# Patient Record
Sex: Male | Born: 1965 | Race: Black or African American | Hispanic: No | Marital: Married | Smoking: Never smoker
Health system: Southern US, Community
[De-identification: ages and names within clinical notes are randomized; demographics above are authoritative.]

## PROBLEM LIST (undated history)

## (undated) DIAGNOSIS — I10 Essential (primary) hypertension: Secondary | ICD-10-CM

---

## 1988-04-21 DIAGNOSIS — M779 Enthesopathy, unspecified: Secondary | ICD-10-CM

## 1988-04-21 HISTORY — DX: Enthesopathy, unspecified: M77.9

## 1999-01-04 ENCOUNTER — Encounter: Payer: Self-pay | Admitting: Family Medicine

## 1999-01-04 ENCOUNTER — Ambulatory Visit (HOSPITAL_COMMUNITY): Admission: RE | Admit: 1999-01-04 | Discharge: 1999-01-04 | Payer: Self-pay | Admitting: Family Medicine

## 2000-10-15 ENCOUNTER — Encounter: Payer: Self-pay | Admitting: Family Medicine

## 2000-10-15 ENCOUNTER — Ambulatory Visit (HOSPITAL_COMMUNITY): Admission: RE | Admit: 2000-10-15 | Discharge: 2000-10-15 | Payer: Self-pay | Admitting: Family Medicine

## 2001-06-03 ENCOUNTER — Ambulatory Visit (HOSPITAL_COMMUNITY): Admission: RE | Admit: 2001-06-03 | Discharge: 2001-06-03 | Payer: Self-pay | Admitting: Family Medicine

## 2001-06-03 ENCOUNTER — Encounter: Payer: Self-pay | Admitting: Family Medicine

## 2001-10-20 ENCOUNTER — Encounter (INDEPENDENT_AMBULATORY_CARE_PROVIDER_SITE_OTHER): Payer: Self-pay | Admitting: Specialist

## 2001-10-20 ENCOUNTER — Ambulatory Visit (HOSPITAL_COMMUNITY): Admission: RE | Admit: 2001-10-20 | Discharge: 2001-10-20 | Payer: Self-pay | Admitting: Oncology

## 2004-05-29 ENCOUNTER — Ambulatory Visit: Payer: Self-pay | Admitting: Oncology

## 2005-05-27 ENCOUNTER — Ambulatory Visit: Payer: Self-pay | Admitting: Oncology

## 2007-11-01 ENCOUNTER — Encounter: Admission: RE | Admit: 2007-11-01 | Discharge: 2007-11-01 | Payer: Self-pay | Admitting: Family Medicine

## 2013-03-10 ENCOUNTER — Emergency Department (HOSPITAL_COMMUNITY)
Admission: EM | Admit: 2013-03-10 | Discharge: 2013-03-10 | Disposition: A | Discharge status: Home / Self Care | Attending: Emergency Medicine | Admitting: Emergency Medicine

## 2013-03-10 ENCOUNTER — Emergency Department (HOSPITAL_COMMUNITY)

## 2013-03-10 ENCOUNTER — Encounter (HOSPITAL_COMMUNITY): Payer: Self-pay | Admitting: Emergency Medicine

## 2013-03-10 DIAGNOSIS — Z79899 Other long term (current) drug therapy: Secondary | ICD-10-CM | POA: Insufficient documentation

## 2013-03-10 DIAGNOSIS — Y990 Civilian activity done for income or pay: Secondary | ICD-10-CM | POA: Insufficient documentation

## 2013-03-10 DIAGNOSIS — S62639B Displaced fracture of distal phalanx of unspecified finger, initial encounter for open fracture: Secondary | ICD-10-CM | POA: Insufficient documentation

## 2013-03-10 DIAGNOSIS — S61209A Unspecified open wound of unspecified finger without damage to nail, initial encounter: Secondary | ICD-10-CM | POA: Diagnosis not present

## 2013-03-10 DIAGNOSIS — S6710XA Crushing injury of unspecified finger(s), initial encounter: Secondary | ICD-10-CM | POA: Insufficient documentation

## 2013-03-10 DIAGNOSIS — Z880 Allergy status to penicillin: Secondary | ICD-10-CM | POA: Insufficient documentation

## 2013-03-10 DIAGNOSIS — S62632B Displaced fracture of distal phalanx of right middle finger, initial encounter for open fracture: Secondary | ICD-10-CM

## 2013-03-10 DIAGNOSIS — S6990XA Unspecified injury of unspecified wrist, hand and finger(s), initial encounter: Secondary | ICD-10-CM | POA: Diagnosis present

## 2013-03-10 DIAGNOSIS — S6980XA Other specified injuries of unspecified wrist, hand and finger(s), initial encounter: Secondary | ICD-10-CM | POA: Diagnosis present

## 2013-03-10 DIAGNOSIS — Y929 Unspecified place or not applicable: Secondary | ICD-10-CM | POA: Insufficient documentation

## 2013-03-10 DIAGNOSIS — W230XXA Caught, crushed, jammed, or pinched between moving objects, initial encounter: Secondary | ICD-10-CM | POA: Insufficient documentation

## 2013-03-10 MED ORDER — LIDOCAINE HCL 2 % IJ SOLN
10.0000 mL | Freq: Once | INTRAMUSCULAR | Status: DC
  Filled 2013-03-10: qty 20

## 2013-03-10 MED ORDER — CEPHALEXIN 500 MG PO CAPS: 500.0000 mg | ORAL_CAPSULE | Freq: Four times a day (QID) | ORAL | Status: DC

## 2013-03-10 MED ORDER — OXYCODONE-ACETAMINOPHEN 5-325 MG PO TABS
1.0000 | ORAL_TABLET | Freq: Once | ORAL | Status: AC
  Administered 2013-03-10: 1 via ORAL
  Filled 2013-03-10: qty 1

## 2013-03-10 MED ORDER — OXYCODONE-ACETAMINOPHEN 5-325 MG PO TABS: 2.0000 | ORAL_TABLET | ORAL | Status: DC | PRN

## 2013-03-10 NOTE — ED Notes (Signed)
Bowie at bedside 

## 2013-03-10 NOTE — ED Notes (Signed)
Suture cart at bedside 

## 2013-03-10 NOTE — ED Provider Notes (Signed)
CSN: [id number]     Arrival date & time 03/10/13  0605 History   First MD Initiated Contact with Patient 03/10/13 [phone]     Chief Complaint  Patient presents with  . Finger Injury   (Consider location/radiation/quality/duration/timing/severity/associated sxs/prior Treatment) HPI  47 year old male presents for evaluation of right middle finger injury. Approx 1-2 hrs ago pt was helping co-worker release a metal dock of a tractor truck when the dock plate fell and crushed his R middle finger against bed of the truck.  Report acute onset of sharp pain to affected finger follows with numbness sensation to tip of finger.  Pain non radiating, persistent, worsening with palpation.  No prior injury to same finger, is UTD with tetanus, no hx of diabetes.  No specific treatment tried.  R hand dominant.  History reviewed. No pertinent past medical history. History reviewed. No pertinent past surgical history. No family history on file. History  Substance Use Topics  . Smoking status: Never Smoker   . Smokeless tobacco: Not on file  . Alcohol Use: Not on file    Review of Systems  Constitutional: Negative for fever.  Skin: Positive for wound.  Neurological: Positive for numbness.    Allergies  Penicillins  Home Medications   Current Outpatient Rx  Name  Route  Sig  Dispense  Refill  . Multiple Vitamin (MULTIVITAMIN WITH MINERALS) TABS tablet   Oral   Take 1 tablet by mouth daily.         Marland Kitchen omega-3 acid ethyl esters (LOVAZA) 1 G capsule   Oral   Take 1 g by mouth daily.         . rosuvastatin (CRESTOR) 20 MG tablet   Oral   Take 20 mg by mouth every other day.          There were no vitals taken for this visit. Physical Exam  Nursing note and vitals reviewed. Constitutional: He appears well-developed and well-nourished. No distress.  HENT:  Head: Atraumatic.  Eyes: Conjunctivae are normal.  Neck: Neck supple.  Musculoskeletal: He exhibits tenderness (R middle finger:  tenderness throughout pad of finger with 2 laceration (1cm to medial pad, and 0.6cm to lateral  pad) , decreased sensation to tip.  no joint involvement, no nail involvement).  R middle finger with decreased flexion/extension at the DIP 2/2 pain, however due to location of injury doubt flexor/extensor tendon injury.    Neurological: He is alert.  Skin: No rash noted.    ED Course  Procedures (including critical care time)  6:38 AM Pt with crushed injury to R middle finger with open lac to pad of finger but no obvious nail involvement. Cyanotic distal tip, 2/2 to microvasculatures disruption. Is UTD with tetanus.  Since crush injury to tip of finger, unlikely to develop compartment syndrome.  Wound is irrigated, xray ordered, will suture lac.  Pain medication given.     7:44 AM LACERATION REPAIR Performed by: Fayrene Helper Authorized byFayrene Helper Consent: Verbal consent obtained. Risks and benefits: risks, benefits and alternatives were discussed Consent given by: patient Patient identity confirmed: provided demographic data Prepped and Draped in normal sterile fashion Wound explored  Laceration Location: R middle finger: Pad  Laceration Length: 1cm  No Foreign Bodies seen or palpated  Anesthesia: digital block  Local anesthetic: lidocaine 1% w/o epinephrine  Anesthetic total: 6 ml  Irrigation method: syringe Amount of cleaning: standard  Skin closure: prolene 5.0  Number of sutures: 4  Technique: simple interrupted  Patient tolerance:  Patient tolerated the procedure well with no immediate complications.  LACERATION REPAIR Performed by: Fayrene Helper Authorized byFayrene Helper Consent: Verbal consent obtained. Risks and benefits: risks, benefits and alternatives were discussed Consent given by: patient Patient identity confirmed: provided demographic data Prepped and Draped in normal sterile fashion Wound explored  Laceration Location: R middle finger:  pad  Laceration Length: 0.6 cm  No Foreign Bodies seen or palpated  Anesthesia: digital block  Local anesthetic: lidocaine lido% w/o epinephrine  Anesthetic total: 6 ml  Irrigation method: syringe Amount of cleaning: standard  Skin closure: prolene 5.0  Number of sutures: 2  Technique: simple interrupted  Patient tolerance: Patient tolerated the procedure well with no immediate complications.  7:55 AM Wound irrigated, lac sutured, finger splint applied, care instruction given, hand specialist referral given.      Labs Review Labs Reviewed - No data to display Imaging Review Dg Finger Middle Right  03/10/2013   CLINICAL DATA:  Crush injury distal long finger.  EXAM: RIGHT MIDDLE FINGER 2+V  COMPARISON:  None.  FINDINGS: The patient has a comminuted fracture of the distal phalanx of the long finger extending from the tuft into the mid diaphysis. There is mild volar displacement of a fragment of the tuft measuring 0.4 cm in diameter. Skin defect along the radial aspect of the patent is compatible laceration. No foreign body is identified.  IMPRESSION: Comminuted fracture distal phalanx right long finger as described.   Electronically Signed   By: Drusilla Kanner M.D.   On: 03/10/2013 07:08    EKG Interpretation   None       MDM   1. Open displaced fracture of distal phalanx of right middle finger, initial encounter    BP 141/85  Pulse 66  Temp(Src) 96.8 F (36 C) (Oral)  Resp 16  SpO2 100%  I have reviewed nursing notes and vital signs. I personally reviewed the imaging tests through PACS system  I reviewed available ER/hospitalization records thought the EMR      Fayrene Helper, [place] 03/10/13 3086

## 2013-03-10 NOTE — ED Notes (Signed)
Pt instructed to soak affected finger in sterile saline/iodine solution.

## 2013-03-10 NOTE — ED Provider Notes (Signed)
Medical screening examination/treatment/procedure(s) were performed by non-physician practitioner and as supervising physician I was immediately available for consultation/collaboration.   Royce Sciara, MD 03/10/13 2343 

## 2013-03-10 NOTE — ED Notes (Addendum)
Pt presents here to ED with deep, unapproximated laceration to the tip of his right middle finger, bleeding controlled. Reports he was helping friend release the metal dock of a tractor trailor truck when the dock plate "lip part" landed on finger and cut finger. Pt able to move hand and barely bend finger, "it just feels numb." Tip of finger appears swollen and blue.

## 2013-03-10 NOTE — ED Notes (Signed)
Ortho pages and responded. enroute.

## 2013-03-10 NOTE — ED Notes (Signed)
Bowie Tran, PA at bedside 

## 2013-03-10 NOTE — Progress Notes (Signed)
Orthopedic Tech Progress Note Patient Details:  Peter Ortiz [birth year] [id number]  Ortho Devices Type of Ortho Device: Finger splint   Haskell Flirt 03/10/2013, 8:05 AM

## 2014-02-01 ENCOUNTER — Ambulatory Visit (INDEPENDENT_AMBULATORY_CARE_PROVIDER_SITE_OTHER): Payer: Federal, State, Local not specified - PPO | Admitting: Sports Medicine

## 2014-02-01 ENCOUNTER — Encounter: Payer: Self-pay | Admitting: Sports Medicine

## 2014-02-01 VITALS — BP 144/85 | HR 71 | Ht 69.0 in | Wt 182.0 lb

## 2014-02-01 DIAGNOSIS — M25571 Pain in right ankle and joints of right foot: Secondary | ICD-10-CM

## 2014-02-01 MED ORDER — MELOXICAM 15 MG PO TABS: ORAL_TABLET | ORAL | Status: DC

## 2014-02-02 NOTE — Progress Notes (Signed)
   Subjective:    Patient ID: Peter Ortiz, male    DOB: 23-Jun-1965, 48 y.o.   MRN: [name]  HPI chief complaint: Right foot pain  Very pleasant 48 year old runner comes in today complaining of 3 weeks of dorsal right foot pain. No injury that he can recall but gradual onset of pain that he localizes to the dorsal midfoot. It was initially present only with running but is now present with walking. In fact, he is walking with a limp now. He runs 3-4 miles 3 times a week. This has been consistent for several months. He denies any increase in activity. Denies any significant problems with his feet in the past. No numbness or tingling. Pain resolves with rest. He recently saw Dr. [facility] and x-rays of his foot per Dr. She[facility]'s report showed some mild midfoot degenerative changes but was otherwise unremarkable. Dr. Thurston [facility] then referred him to our office for running evaluation and consideration of orthotics. He has been taking some intermittent over-the-counter Aleve which has been somewhat helpful. He has not been running for the past 3 weeks.  Past medical history reviewed. Medications reviewed. No known drug allergies. Socially he does not smoke, does not drink, and works for the [employer].    Review of Systems     Objective:   Physical Exam Well-developed, fit-appearing. No acute distress. Awake alert and oriented x3. Vital signs reviewed.  Right foot: There is tenderness to palpation across the dorsum of the midfoot. Mild soft tissue swelling in this area as well. No ecchymosis. No tenderness over the navicular. No tenderness to palpation at the ankle. No pain with resisted great toe extension or extension of the lesser toes. Fairly neutral arch. Evaluation of his gait shows him to supinate slightly. He is walking with a limp. I am unable to evaluate his running form do to his limping.       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  His symptoms have been present now for 3 weeks and are worsening. He is now walking with a limp. The nature of his job is keeping his foot aggravated so I think we need to keep him out of work for the next 2 weeks until his next followup appointment with me. In the interim, I will place him into a short Cam Walker to rest the foot. I will place him on meloxicam 15 mg daily for 10 days. He is cautioned about GI upset. He will start icing his foot daily. I also gave him an arch strap to try. Once his symptoms resolve we will do a more thorough running evaluation. Patient will need an insert with a lateral heel wedge to correct his supination. If symptoms persist at followup I would consider merits of further diagnostic imaging. I've asked the patient to drop off his x-rays from [facility] for my review prior to his next visit.

## 2014-02-15 ENCOUNTER — Ambulatory Visit (INDEPENDENT_AMBULATORY_CARE_PROVIDER_SITE_OTHER): Payer: Federal, State, Local not specified - PPO | Admitting: Sports Medicine

## 2014-02-15 VITALS — BP 134/83 | HR 74 | Ht 69.0 in | Wt 182.0 lb

## 2014-02-15 DIAGNOSIS — M25571 Pain in right ankle and joints of right foot: Secondary | ICD-10-CM

## 2014-02-16 NOTE — Progress Notes (Signed)
   Subjective:    Patient ID: Peter Ortiz, male    DOB: 1966-04-07, 48 y.o.   MRN: [id number]  HPI Patient comes in today for follow-up on dorsal right foot pain. Pain has resolved with 3 weeks of immobilization in a Cam Walker. Swelling has resolved as well. He has been out of work since his last office visit. His arch strap has been helpful as well. Meloxicam was also helpful. He brought a copy of [facility] for me to review.    Review of Systems     Objective:   Physical Exam Well-developed, fit appearing. No acute distress. Awake alert and oriented 3  Right foot: Flexible pes cavus foot. No tenderness to palpation across the dorsum of the foot. No soft tissue swelling. Negative metatarsal squeeze. Patient walks and runs with a slightly supinated gait. Right foot is slightly externally rotated as well. No limp. Right hip demonstrates a tight IT band. Negative log roll. X-rays of the right foot dated 01/19/2014 from [facility] are reviewed. Overall they appear to be fairly normal. There may be some mild midfoot degenerative changes but otherwise unremarkable.       Assessment & Plan:  Improved dorsal right foot pain likely secondary to mild midfoot DJD Supination  Green sports insoles with lateral heel wedges to help correct supination. IT band stretches and line drills to help correct his externally rotated right foot with running. However, I do not yet want the patient to resume running. Instead, I would like for him to return to work next week and make sure that he can do his job without returning pain before starting any running. He will follow-up with me in 3 weeks. Continue with his arch strap. If symptoms return I would consider merits further diagnostic imaging. If patient is still doing well at his follow-up visit, I would consider custom orthotics. Call with questions or concerns prior to his follow-up visit.

## 2014-03-08 ENCOUNTER — Ambulatory Visit: Payer: Self-pay | Admitting: Sports Medicine

## 2014-11-22 ENCOUNTER — Encounter: Payer: Self-pay | Admitting: Sports Medicine

## 2014-11-22 ENCOUNTER — Ambulatory Visit (INDEPENDENT_AMBULATORY_CARE_PROVIDER_SITE_OTHER): Payer: Federal, State, Local not specified - PPO | Admitting: Sports Medicine

## 2014-11-22 VITALS — Ht 68.0 in | Wt 184.0 lb

## 2014-11-22 DIAGNOSIS — M79673 Pain in unspecified foot: Secondary | ICD-10-CM | POA: Diagnosis not present

## 2014-11-22 DIAGNOSIS — M79602 Pain in left arm: Secondary | ICD-10-CM

## 2014-11-22 MED ORDER — NAPROXEN 500 MG PO TABS: 500.0000 mg | ORAL_TABLET | Freq: Two times a day (BID) | ORAL | Status: DC

## 2014-11-22 NOTE — Progress Notes (Signed)
   Subjective:    Patient ID: Peter Ortiz, male    DOB: [birth year], 49 y.o.   MRN: [id number]  HPI chief complaint: Left forearm pain  Patient comes in today complaining of 3-4 weeks of left forearm pain. No trauma. He denies any change in activity. He describes a burning type of pain that is primarily along the proximal radial side of the forearm. No swelling. No pain in the elbow. No associated numbness or tingling. He notices it primarily with holding the elbow in a flexed position and supination of the forearm. It does not interfere with his ability to be active. He denies similar symptoms in the past.  Past medical history reviewed Medications reviewed Allergies reviewed    Review of Systems     Objective:   Physical Exam Well-developed, well-nourished. No acute distress  Left arm: Full painless elbow range of motion. No effusion. No tenderness to palpation at the lateral epicondyle. There is slight tenderness to palpation along the extensor muscle wad as well as in the radial tunnel. No soft tissue swelling. No erythema. No rash. Good grip strength. Good radial ulnar pulses.       Assessment & Plan:  Left forearm pain likely secondary to simple muscle strain vs radial tunnel syndrome  Naprosyn 500 mg twice a day for 7 days then when necessary. Body helix compression sleeve with activity. Avoid aggravating activity but continue with other activity as tolerated. Follow-up with me in 3 weeks if symptoms persist at which point I would consider imaging.  Of note, patient was provided with new bilateral arch straps today as well as. This is for chronic bilateral foot pain.

## 2014-12-11 ENCOUNTER — Other Ambulatory Visit: Payer: Self-pay | Admitting: *Deleted

## 2014-12-11 MED ORDER — NAPROXEN 500 MG PO TABS: ORAL_TABLET | ORAL | Status: DC

## 2014-12-13 ENCOUNTER — Ambulatory Visit: Payer: Federal, State, Local not specified - PPO | Admitting: Sports Medicine

## 2015-05-21 ENCOUNTER — Ambulatory Visit: Payer: Federal, State, Local not specified - PPO | Admitting: Sports Medicine

## 2015-05-30 ENCOUNTER — Encounter: Payer: Self-pay | Admitting: Sports Medicine

## 2015-05-30 ENCOUNTER — Ambulatory Visit
Admission: RE | Admit: 2015-05-30 | Discharge: 2015-05-30 | Disposition: A | Discharge status: Home / Self Care | Payer: Federal, State, Local not specified - PPO | Source: Ambulatory Visit | Attending: Sports Medicine | Admitting: Sports Medicine

## 2015-05-30 ENCOUNTER — Ambulatory Visit (INDEPENDENT_AMBULATORY_CARE_PROVIDER_SITE_OTHER): Payer: Federal, State, Local not specified - PPO | Admitting: Sports Medicine

## 2015-05-30 VITALS — BP 132/76 | HR 66 | Ht 68.0 in | Wt 184.0 lb

## 2015-05-30 DIAGNOSIS — M25562 Pain in left knee: Secondary | ICD-10-CM

## 2015-05-30 MED ORDER — NAPROXEN 500 MG PO TABS: ORAL_TABLET | ORAL | Status: DC

## 2015-05-30 NOTE — Progress Notes (Signed)
   Subjective:    Patient ID: Peter Ortiz, male    DOB: 1966-03-01, 50 y.o.   MRN: [id number]  HPI chief complaint: Left knee pain  50 year old male comes in today complaining of intermittent medial sided left knee pain which has been going on for about 2 months. He denies any trauma. He describes an aching type of discomfort along the medial knee which is present primarily with running. He enjoys running on the treadmill. When his knee first started hurting he took some naproxen sodium which did help. When his pain resolved he try to resume running but his symptoms returned. He denies any swelling. No feelings of instability. No locking or catching. No prior problems with the knee in the past. No prior knee surgeries. No radiation of the pain. No associated numbness or tingling. He is able to do other types of exercise such as the elliptical without any pain. He has not had any imaging of his knee.    Review of Systems    as above Objective:   Physical Exam  Well-developed, well-nourished. No acute distress. Awake alert and oriented 3. Vital signs reviewed  Left knee: Full range of motion. No effusion. He is tender to palpation along the medial joint line with pain but no popping with McMurray's. Positive Thessaly's. No tenderness along the lateral joint line. Trace patellofemoral crepitus. Knee is stable to valgus and varus stressing. Negative anterior drawer, negative posterior drawer. No popliteal cyst. No tenderness to palpation directly over the pes anserine bursa. Neurovascularly intact distally. Walking with a slight limp..      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 Since he did have good symptom relief with naproxen sodium I have elected to refill this. He will take it for 7 days and then as needed. We will also fit him with a compression sleeve to wear with  activity. He will start isometric quad exercises and follow-up with me in 6 weeks. I want him to cross train for the next 4 weeks (no running). He can then resume running as symptoms allow. He will get x-rays of his knee prior to his follow-up visit with me. We will delineate further workup and treatment based on his x-ray results and response to today's treatment.

## 2015-05-31 ENCOUNTER — Telehealth: Payer: Self-pay | Admitting: Sports Medicine

## 2015-05-31 NOTE — Telephone Encounter (Signed)
I left a message today on the patient's voicemail regarding the x-ray results of his left knee. He has minimal joint space narrowing in the medial compartment. Otherwise, no significant degenerative changes. He will follow-up with me in 6 weeks as scheduled. If symptoms persist I may need to consider further diagnostic imaging.

## 2015-07-10 ENCOUNTER — Encounter: Payer: Self-pay | Admitting: Sports Medicine

## 2015-07-10 ENCOUNTER — Ambulatory Visit (INDEPENDENT_AMBULATORY_CARE_PROVIDER_SITE_OTHER): Payer: Federal, State, Local not specified - PPO | Admitting: Sports Medicine

## 2015-07-10 VITALS — BP 138/78 | Ht 68.0 in | Wt 184.0 lb

## 2015-07-10 DIAGNOSIS — M25562 Pain in left knee: Secondary | ICD-10-CM

## 2015-07-10 NOTE — Progress Notes (Signed)
   Subjective:    Patient ID: Peter Ortiz, male    DOB: 19-Mar-1966, 50 y.o.   MRN: [id number]  HPI   Patient comes in today for follow-up on left knee pain. Overall, he feels like his pain has improved. He has no pain with walking. He did return to some limited running a couple of weeks ago but had some pain a couple of days after his second run. No swelling. No instability. No locking or catching. He took his naproxen sodium for 7 days and does feel like that made a difference. He is currently not taking any anti-inflammatories or Tylenol. The compression sleeve would continuously roll down his leg with activity so he discontinued using it. In addition to running he has also started bowling again. No pain with bowling. No significant pain at rest. He has been diligent about doing his home exercises and feels like they are helping.    Review of Systems As above    Objective:   Physical Exam  Well-developed, well-nourished. No acute distress. Awake alert and oriented 3. Vital signs reviewed  Left knee: Patient continues to demonstrate full range of motion. No effusion. He is still tender to palpation along the medial joint line but a negative McMurray's. Good joint stability. Trace patellofemoral crepitus. Neurovascularly intact distally. Walking without a noticeable limp.  X-rays of his left knee show some mild medial compartmental narrowing consistent with mild DJD here. Otherwise, unremarkable.      Assessment & Plan:   Left knee pain secondary to mild medial compartmental DJD versus degenerative meniscal tear  I discussed the patient's treatment options including continued activity modification, cortisone injection, and MRI. At this point we are going to have him continue with activity modification and his home exercise program. I've also given him the name of [employer] and he will contact them directly for a new compression sleeve which will hopefully be better fitting. I think  the combination of bowling and running is causing him some persistent discomfort. I have asked that he try to space out these activities over several days and not bowl or run on back-to-back days. He does not have any mechanical symptoms nor does he have swelling. I'm hoping that a longer period of relative rest will improve his pain. He understands that if his symptoms persist, then he can return to the office for cortisone injection. With his lack of mechanical symptoms I would likely start with the injection prior to further diagnostic imaging. Patient agrees. He will continue with his home exercise program. Follow-up with me as needed.

## 2015-10-15 DIAGNOSIS — J039 Acute tonsillitis, unspecified: Secondary | ICD-10-CM | POA: Diagnosis not present

## 2016-01-28 DIAGNOSIS — Z23 Encounter for immunization: Secondary | ICD-10-CM | POA: Diagnosis not present

## 2016-04-07 DIAGNOSIS — M654 Radial styloid tenosynovitis [de Quervain]: Secondary | ICD-10-CM | POA: Diagnosis not present

## 2016-04-11 ENCOUNTER — Ambulatory Visit: Payer: Self-pay | Admitting: Sports Medicine

## 2016-05-14 DIAGNOSIS — R1902 Left upper quadrant abdominal swelling, mass and lump: Secondary | ICD-10-CM | POA: Diagnosis not present

## 2016-05-19 ENCOUNTER — Other Ambulatory Visit: Payer: Self-pay | Admitting: Physician Assistant

## 2016-05-19 DIAGNOSIS — R1010 Upper abdominal pain, unspecified: Secondary | ICD-10-CM

## 2016-05-26 ENCOUNTER — Other Ambulatory Visit: Payer: Self-pay | Admitting: Physician Assistant

## 2016-05-26 ENCOUNTER — Ambulatory Visit
Admission: RE | Admit: 2016-05-26 | Discharge: 2016-05-26 | Disposition: A | Discharge status: Home / Self Care | Payer: Federal, State, Local not specified - PPO | Source: Ambulatory Visit | Attending: Physician Assistant | Admitting: Physician Assistant

## 2016-05-26 DIAGNOSIS — R1010 Upper abdominal pain, unspecified: Secondary | ICD-10-CM

## 2016-05-26 DIAGNOSIS — R1902 Left upper quadrant abdominal swelling, mass and lump: Secondary | ICD-10-CM | POA: Diagnosis not present

## 2016-07-15 DIAGNOSIS — E782 Mixed hyperlipidemia: Secondary | ICD-10-CM | POA: Diagnosis not present

## 2016-07-15 DIAGNOSIS — Z125 Encounter for screening for malignant neoplasm of prostate: Secondary | ICD-10-CM | POA: Diagnosis not present

## 2016-07-15 DIAGNOSIS — Z0000 Encounter for general adult medical examination without abnormal findings: Secondary | ICD-10-CM | POA: Diagnosis not present

## 2016-07-30 DIAGNOSIS — L309 Dermatitis, unspecified: Secondary | ICD-10-CM | POA: Diagnosis not present

## 2016-07-30 DIAGNOSIS — L738 Other specified follicular disorders: Secondary | ICD-10-CM | POA: Diagnosis not present

## 2017-01-26 DIAGNOSIS — I10 Essential (primary) hypertension: Secondary | ICD-10-CM | POA: Diagnosis not present

## 2017-01-26 DIAGNOSIS — K602 Anal fissure, unspecified: Secondary | ICD-10-CM | POA: Diagnosis not present

## 2017-01-26 DIAGNOSIS — Z23 Encounter for immunization: Secondary | ICD-10-CM | POA: Diagnosis not present

## 2017-02-16 DIAGNOSIS — J0100 Acute maxillary sinusitis, unspecified: Secondary | ICD-10-CM | POA: Diagnosis not present

## 2017-03-02 DIAGNOSIS — J3489 Other specified disorders of nose and nasal sinuses: Secondary | ICD-10-CM | POA: Diagnosis not present

## 2017-04-16 DIAGNOSIS — S76312A Strain of muscle, fascia and tendon of the posterior muscle group at thigh level, left thigh, initial encounter: Secondary | ICD-10-CM | POA: Diagnosis not present

## 2017-05-11 DIAGNOSIS — M79652 Pain in left thigh: Secondary | ICD-10-CM | POA: Diagnosis not present

## 2017-05-27 ENCOUNTER — Other Ambulatory Visit: Payer: Self-pay | Admitting: Family Medicine

## 2017-05-27 DIAGNOSIS — M79652 Pain in left thigh: Secondary | ICD-10-CM

## 2017-05-31 ENCOUNTER — Ambulatory Visit
Admission: RE | Admit: 2017-05-31 | Discharge: 2017-05-31 | Disposition: A | Discharge status: Home / Self Care | Payer: Federal, State, Local not specified - PPO | Source: Ambulatory Visit | Attending: Family Medicine | Admitting: Family Medicine

## 2017-05-31 DIAGNOSIS — M79652 Pain in left thigh: Secondary | ICD-10-CM

## 2017-06-04 DIAGNOSIS — M79652 Pain in left thigh: Secondary | ICD-10-CM | POA: Diagnosis not present

## 2017-06-04 DIAGNOSIS — M25552 Pain in left hip: Secondary | ICD-10-CM | POA: Diagnosis not present

## 2017-07-20 DIAGNOSIS — M25552 Pain in left hip: Secondary | ICD-10-CM | POA: Diagnosis not present

## 2017-07-20 DIAGNOSIS — M79652 Pain in left thigh: Secondary | ICD-10-CM | POA: Diagnosis not present

## 2017-07-21 DIAGNOSIS — A6000 Herpesviral infection of urogenital system, unspecified: Secondary | ICD-10-CM | POA: Diagnosis not present

## 2017-07-21 DIAGNOSIS — J309 Allergic rhinitis, unspecified: Secondary | ICD-10-CM | POA: Diagnosis not present

## 2017-07-21 DIAGNOSIS — Z0000 Encounter for general adult medical examination without abnormal findings: Secondary | ICD-10-CM | POA: Diagnosis not present

## 2017-07-21 DIAGNOSIS — Z125 Encounter for screening for malignant neoplasm of prostate: Secondary | ICD-10-CM | POA: Diagnosis not present

## 2017-07-21 DIAGNOSIS — I10 Essential (primary) hypertension: Secondary | ICD-10-CM | POA: Diagnosis not present

## 2017-07-21 DIAGNOSIS — E782 Mixed hyperlipidemia: Secondary | ICD-10-CM | POA: Diagnosis not present

## 2017-07-29 DIAGNOSIS — M79652 Pain in left thigh: Secondary | ICD-10-CM | POA: Diagnosis not present

## 2017-07-29 DIAGNOSIS — M25552 Pain in left hip: Secondary | ICD-10-CM | POA: Diagnosis not present

## 2017-08-05 DIAGNOSIS — M25552 Pain in left hip: Secondary | ICD-10-CM | POA: Diagnosis not present

## 2017-08-05 DIAGNOSIS — M79652 Pain in left thigh: Secondary | ICD-10-CM | POA: Diagnosis not present

## 2017-08-12 DIAGNOSIS — M25552 Pain in left hip: Secondary | ICD-10-CM | POA: Diagnosis not present

## 2017-08-12 DIAGNOSIS — M79652 Pain in left thigh: Secondary | ICD-10-CM | POA: Diagnosis not present

## 2017-08-19 DIAGNOSIS — M25552 Pain in left hip: Secondary | ICD-10-CM | POA: Diagnosis not present

## 2017-08-19 DIAGNOSIS — M79652 Pain in left thigh: Secondary | ICD-10-CM | POA: Diagnosis not present

## 2017-12-15 DIAGNOSIS — Z23 Encounter for immunization: Secondary | ICD-10-CM | POA: Diagnosis not present

## 2017-12-15 DIAGNOSIS — N529 Male erectile dysfunction, unspecified: Secondary | ICD-10-CM | POA: Diagnosis not present

## 2017-12-23 ENCOUNTER — Encounter: Payer: Self-pay | Admitting: Podiatry

## 2017-12-23 ENCOUNTER — Ambulatory Visit: Payer: Federal, State, Local not specified - PPO | Admitting: Podiatry

## 2017-12-23 ENCOUNTER — Other Ambulatory Visit: Payer: Self-pay

## 2017-12-23 DIAGNOSIS — B351 Tinea unguium: Secondary | ICD-10-CM

## 2017-12-23 MED ORDER — TERBINAFINE HCL 250 MG PO TABS: 250.0000 mg | ORAL_TABLET | Freq: Every day | ORAL | 0 refills | Status: DC

## 2017-12-27 NOTE — Progress Notes (Signed)
Subjective:   Patient ID: Peter Ortiz, male   DOB: 52 y.o.   MRN: [id number]   HPI Patient presents stating he has had a lot of irritation between the second third third fourth toes of his left foot and also has had nail discoloration that occurred.  Patient states it irritated and itchy and he like to have treatment and is tried topical over-the-counter medication.  Patient does not smoke and likes to be active   Review of Systems  All other systems reviewed and are negative.       Objective:  Physical Exam  Constitutional: He appears well-developed and well-nourished.  Cardiovascular: Intact distal pulses.  Pulmonary/Chest: Effort normal.  Musculoskeletal: Normal range of motion.  Neurological: He is alert.  Skin: Skin is warm.  Nursing note and vitals reviewed.   Neurovascular status found to be intact muscle strength is adequate range of motion within normal limits with patient noted to have irritation between the second third third toes left foot that is localized with no proximal edema erythema or drainage noted     Assessment:  Irritation that is most likely fungal in nature with also mild mycotic nail disease present     Plan:  H&P education on condition given to patient and I recommended oral Lamisil therapy.  Patient is written prescription for 60 days of oral Lamisil and I did explain the risk associated with this and patient will start taking this medication.  Patient did have liver function studies done and they were normal and is encouraged to also send me that result but he does state that they were normal

## 2018-02-08 DIAGNOSIS — H9313 Tinnitus, bilateral: Secondary | ICD-10-CM | POA: Diagnosis not present

## 2018-02-25 DIAGNOSIS — S39013A Strain of muscle, fascia and tendon of pelvis, initial encounter: Secondary | ICD-10-CM | POA: Diagnosis not present

## 2018-02-25 DIAGNOSIS — H9313 Tinnitus, bilateral: Secondary | ICD-10-CM | POA: Diagnosis not present

## 2018-03-04 DIAGNOSIS — H9313 Tinnitus, bilateral: Secondary | ICD-10-CM | POA: Diagnosis not present

## 2018-05-11 DIAGNOSIS — L309 Dermatitis, unspecified: Secondary | ICD-10-CM | POA: Diagnosis not present

## 2018-07-27 DIAGNOSIS — N529 Male erectile dysfunction, unspecified: Secondary | ICD-10-CM | POA: Diagnosis not present

## 2018-07-27 DIAGNOSIS — Z125 Encounter for screening for malignant neoplasm of prostate: Secondary | ICD-10-CM | POA: Diagnosis not present

## 2018-07-27 DIAGNOSIS — I10 Essential (primary) hypertension: Secondary | ICD-10-CM | POA: Diagnosis not present

## 2018-07-27 DIAGNOSIS — A6000 Herpesviral infection of urogenital system, unspecified: Secondary | ICD-10-CM | POA: Diagnosis not present

## 2018-07-27 DIAGNOSIS — E782 Mixed hyperlipidemia: Secondary | ICD-10-CM | POA: Diagnosis not present

## 2018-07-27 DIAGNOSIS — Z0000 Encounter for general adult medical examination without abnormal findings: Secondary | ICD-10-CM | POA: Diagnosis not present

## 2018-09-03 DIAGNOSIS — S86811A Strain of other muscle(s) and tendon(s) at lower leg level, right leg, initial encounter: Secondary | ICD-10-CM | POA: Diagnosis not present

## 2018-09-30 DIAGNOSIS — S86811D Strain of other muscle(s) and tendon(s) at lower leg level, right leg, subsequent encounter: Secondary | ICD-10-CM | POA: Diagnosis not present

## 2018-11-01 DIAGNOSIS — T63444A Toxic effect of venom of bees, undetermined, initial encounter: Secondary | ICD-10-CM | POA: Diagnosis not present

## 2019-03-15 ENCOUNTER — Other Ambulatory Visit: Payer: Self-pay

## 2019-03-15 DIAGNOSIS — Z20822 Contact with and (suspected) exposure to covid-19: Secondary | ICD-10-CM

## 2019-03-17 LAB — NOVEL CORONAVIRUS, NAA: SARS-CoV-2, NAA: NOT DETECTED

## 2019-03-21 DIAGNOSIS — A6000 Herpesviral infection of urogenital system, unspecified: Secondary | ICD-10-CM | POA: Diagnosis not present

## 2019-05-04 ENCOUNTER — Encounter: Payer: Self-pay | Admitting: Allergy and Immunology

## 2019-05-04 ENCOUNTER — Ambulatory Visit: Payer: Federal, State, Local not specified - PPO | Admitting: Allergy and Immunology

## 2019-05-04 ENCOUNTER — Other Ambulatory Visit: Payer: Self-pay

## 2019-05-04 VITALS — BP 140/80 | HR 72 | Temp 98.4°F | Resp 16 | Ht 68.11 in | Wt 191.4 lb

## 2019-05-04 DIAGNOSIS — R05 Cough: Secondary | ICD-10-CM | POA: Diagnosis not present

## 2019-05-04 DIAGNOSIS — J310 Chronic rhinitis: Secondary | ICD-10-CM

## 2019-05-04 DIAGNOSIS — R040 Epistaxis: Secondary | ICD-10-CM | POA: Insufficient documentation

## 2019-05-04 DIAGNOSIS — R053 Chronic cough: Secondary | ICD-10-CM

## 2019-05-04 MED ORDER — CARBINOXAMINE MALEATE 4 MG PO TABS: ORAL_TABLET | ORAL | 5 refills | Status: DC

## 2019-05-04 MED ORDER — AZELASTINE HCL 0.1 % NA SOLN: NASAL | 5 refills | Status: DC

## 2019-05-04 NOTE — Assessment & Plan Note (Signed)
   Proper technique for stanching epistaxis has been discussed and demonstrated.  Nasal saline spray and/or nasal saline gel is recommended to moisturize nasal mucosa.  The use of a cool-mist humidifier during the night is recommended.  During epistaxis, if needed, oxymetazoline (Afrin) nasal spray may be applied to a cotton ball to help stanch the blood flow.  If this problem persists or progresses, otolaryngology evaluation may be warranted.  

## 2019-05-04 NOTE — Progress Notes (Signed)
New Patient Note  RE: [name] MRN: [id number] DOB: 1965/12/19 Date of Office Visit: 05/04/2019  Referring provider: Tally Joe, MD Primary care provider: Tally Joe, MD  Chief Complaint: Allergic Rhinitis  and Cough  History of present illness: Peter Ortiz is a 54 y.o. male seen today in consultation requested by Tally Joe, MD.  He reports that over the past 6 months he has experienced nasal congestion, rhinorrhea, sneezing, nasal pruritus, ocular pruritus, and sinus pressure over the cheekbones.  He notes that he has had more dust exposure over the past 6 months at work.  Otherwise, no significant seasonal symptom variation has been noted nor have specific environmental triggers been identified.  He attempts to control the symptoms with fexofenadine 180 mg daily which has provided moderate benefit.  He also complains of a persistent cough.  The cough is described as dry/nonproductive and originates in the throat.  He denies chest tightness, dyspnea, and wheezing.  He experiences heartburn occasionally and takes famotidine as needed.  He experiences mild epistaxis out of the right nostril 1 time per month on average.  The bleeding typically stops without active measures to stanch the bleed.  Assessment and plan: Chronic rhinitis All seasonal and perennial aeroallergen skin tests are negative despite a positive histamine control.  Intranasal steroids, intranasal antihistamines, and first generation antihistamines are effective for symptoms associated with non-allergic rhinitis, whereas second generation antihistamines such as cetirizine (Zyrtec), loratadine (Claritin) and fexofenadine (Allegra) have been found to be ineffective for this condition.  A prescription has been provided for azelastine nasal spray, one spray per nostril 1-2 times daily as needed. Proper nasal spray technique has been discussed and demonstrated.  Nasal saline lavage (NeilMed) has been recommended  as needed and prior to medicated nasal sprays along with instructions for proper administration.  A prescription has been provided for carbinoxamine maleate 4mg  every 6-8 hours as needed.  Cough, persistent The patient's history and physical examination suggest upper airway cough syndrome.  Spirometry today reveals normal ventilatory function. We will aggressively treat postnasal drainage and evaluate results.  Treatment plan as outlined above.  For thick post nasal drainage, add guaifenesin 1200 mg (Mucinex Maximum Strength)  twice daily as needed with adequate hydration as discussed.  Epistaxis  Proper technique for stanching epistaxis has been discussed and demonstrated.  Nasal saline spray and/or nasal saline gel is recommended to moisturize nasal mucosa.  The use of a cool-mist humidifier during the night is recommended.  During epistaxis, if needed, oxymetazoline (Afrin) nasal spray may be applied to a cotton ball to help stanch the blood flow.  If this problem persists or progresses, otolaryngology evaluation may be warranted.    Meds ordered this encounter  Medications  . azelastine (ASTELIN) 0.1 % nasal spray    Sig: 1 spray per nostril 1-2 times a day as needed    Dispense:  30 mL    Refill:  5  . Carbinoxamine Maleate 4 MG TABS    Sig: One tablet every 6-8 hours as needed    Dispense:  120 tablet    Refill:  5    Diagnostics: Spirometry: FVC was 2.85 L and FEV1 was 2.61 L (86% predicted) without postbronchodilator improvement.  Please see scanned spirometry results for details. Epicutaneous testing: Negative despite a positive histamine control. Intradermal testing: Negative.   Physical examination: Blood pressure 140/80, pulse 72, temperature 98.4 F (36.9 C), temperature source Oral, resp. rate 16, height 5' 8.11" (1.73 m), weight  191 lb 6.4 oz (86.8 kg), SpO2 99 %.  General: Alert, interactive, in no acute distress. HEENT: TMs pearly gray, turbinates  moderately edematous without discharge, post-pharynx erythematous. Neck: Supple without lymphadenopathy. Lungs: Clear to auscultation without wheezing, rhonchi or rales. CV: Normal S1, S2 without murmurs. Abdomen: Nondistended, nontender. Skin: Warm and dry, without lesions or rashes. Extremities:  No clubbing, cyanosis or edema. Neuro:   Grossly intact.  Review of systems:  Review of systems negative except as noted in HPI / PMHx or noted below: Review of Systems  Constitutional: Negative.   HENT: Negative.   Eyes: Negative.   Respiratory: Negative.   Cardiovascular: Negative.   Gastrointestinal: Negative.   Genitourinary: Negative.   Musculoskeletal: Negative.   Skin: Negative.   Neurological: Negative.   Endo/Heme/Allergies: Negative.   Psychiatric/Behavioral: Negative.     Past medical history:  Past Medical History:  Diagnosis Date  . Tendinitis 1990    Past surgical history:  History reviewed. No pertinent surgical history.  Family history: Family History  Problem Relation Age of Onset  . Asthma Maternal Uncle   . Allergic rhinitis Neg Hx   . Angioedema Neg Hx   . Atopy Neg Hx   . Eczema Neg Hx   . Immunodeficiency Neg Hx   . Urticaria Neg Hx     Social history: Social History   Socioeconomic History  . Marital status: Married    Spouse name: Not on file  . Number of children: Not on file  . Years of education: Not on file  . Highest education level: Not on file  Occupational History  . Not on file  Tobacco Use  . Smoking status: Never Smoker  . Smokeless tobacco: Never Used  Substance and Sexual Activity  . Alcohol use: Not on file  . Drug use: Not on file  . Sexual activity: Not on file  Other Topics Concern  . Not on file  Social History Narrative  . Not on file   Social Determinants of Health   Financial Resource Strain:   . Difficulty of Paying Living Expenses: Not on file  Food Insecurity:   . Worried About Charity fundraiser in  the Last Year: Not on file  . Ran Out of Food in the Last Year: Not on file  Transportation Needs:   . Lack of Transportation (Medical): Not on file  . Lack of Transportation (Non-Medical): Not on file  Physical Activity:   . Days of Exercise per Week: Not on file  . Minutes of Exercise per Session: Not on file  Stress:   . Feeling of Stress : Not on file  Social Connections:   . Frequency of Communication with Friends and Family: Not on file  . Frequency of Social Gatherings with Friends and Family: Not on file  . Attends Religious Services: Not on file  . Active Member of Clubs or Organizations: Not on file  . Attends Archivist Meetings: Not on file  . Marital Status: Not on file  Intimate Partner Violence:   . Fear of Current or Ex-Partner: Not on file  . Emotionally Abused: Not on file  . Physically Abused: Not on file  . Sexually Abused: Not on file    Environmental History: Patient lives in a house built in 2003 with carpeting throughout, gas heat, and central air.  There is no known mold/water damage in the home.  There are no pets in the home.  He is a never-smoker.  Current  Outpatient Medications  Medication Sig Dispense Refill  . atorvastatin (LIPITOR) 80 MG tablet Take 80 mg by mouth daily.     . Multiple Vitamin (MULTIVITAMIN WITH MINERALS) TABS tablet Take 1 tablet by mouth daily.    Marland Kitchen omega-3 acid ethyl esters (LOVAZA) 1 G capsule Take 1 g by mouth daily.    . valACYclovir (VALTREX) 1000 MG tablet Take 1,000 mg by mouth as needed.   1  . azelastine (ASTELIN) 0.1 % nasal spray 1 spray per nostril 1-2 times a day as needed 30 mL 5  . Carbinoxamine Maleate 4 MG TABS One tablet every 6-8 hours as needed 120 tablet 5   No current facility-administered medications for this visit.    Known medication allergies: Allergies  Allergen Reactions  . Sulfamethoxazole Rash    I appreciate the opportunity to take part in Saathvik's care. Please do not hesitate to  contact me with questions.  Sincerely,   R. Jorene Guest, MD

## 2019-05-04 NOTE — Assessment & Plan Note (Signed)
All seasonal and perennial aeroallergen skin tests are negative despite a positive histamine control.  Intranasal steroids, intranasal antihistamines, and first generation antihistamines are effective for symptoms associated with non-allergic rhinitis, whereas second generation antihistamines such as cetirizine (Zyrtec), loratadine (Claritin) and fexofenadine (Allegra) have been found to be ineffective for this condition.  A prescription has been provided for azelastine nasal spray, one spray per nostril 1-2 times daily as needed. Proper nasal spray technique has been discussed and demonstrated.  Nasal saline lavage (NeilMed) has been recommended as needed and prior to medicated nasal sprays along with instructions for proper administration.  A prescription has been provided for carbinoxamine maleate 4mg every 6-8 hours as needed. 

## 2019-05-04 NOTE — Assessment & Plan Note (Signed)
The patient's history and physical examination suggest upper airway cough syndrome.  Spirometry today reveals normal ventilatory function. We will aggressively treat postnasal drainage and evaluate results.  Treatment plan as outlined above.  For thick post nasal drainage, add guaifenesin 1200 mg (Mucinex Maximum Strength)  twice daily as needed with adequate hydration as discussed.

## 2019-05-04 NOTE — Patient Instructions (Addendum)
Chronic rhinitis All seasonal and perennial aeroallergen skin tests are negative despite a positive histamine control.  Intranasal steroids, intranasal antihistamines, and first generation antihistamines are effective for symptoms associated with non-allergic rhinitis, whereas second generation antihistamines such as cetirizine (Zyrtec), loratadine (Claritin) and fexofenadine (Allegra) have been found to be ineffective for this condition.  A prescription has been provided for azelastine nasal spray, one spray per nostril 1-2 times daily as needed. Proper nasal spray technique has been discussed and demonstrated.  Nasal saline lavage (NeilMed) has been recommended as needed and prior to medicated nasal sprays along with instructions for proper administration.  A prescription has been provided for carbinoxamine maleate 4mg  every 6-8 hours as needed.  Cough, persistent The patient's history and physical examination suggest upper airway cough syndrome.  Spirometry today reveals normal ventilatory function. We will aggressively treat postnasal drainage and evaluate results.  Treatment plan as outlined above.  For thick post nasal drainage, add guaifenesin 1200 mg (Mucinex Maximum Strength)  twice daily as needed with adequate hydration as discussed.  Epistaxis  Proper technique for stanching epistaxis has been discussed and demonstrated.  Nasal saline spray and/or nasal saline gel is recommended to moisturize nasal mucosa.  The use of a cool-mist humidifier during the night is recommended.  During epistaxis, if needed, oxymetazoline (Afrin) nasal spray may be applied to a cotton ball to help stanch the blood flow.  If this problem persists or progresses, otolaryngology evaluation may be warranted.    Return in about 3 months (around 08/02/2019), or if symptoms worsen or fail to improve.

## 2019-05-23 DIAGNOSIS — R1011 Right upper quadrant pain: Secondary | ICD-10-CM | POA: Diagnosis not present

## 2019-05-24 ENCOUNTER — Other Ambulatory Visit: Payer: Self-pay | Admitting: Family Medicine

## 2019-05-24 DIAGNOSIS — R1011 Right upper quadrant pain: Secondary | ICD-10-CM

## 2019-05-30 ENCOUNTER — Ambulatory Visit
Admission: RE | Admit: 2019-05-30 | Discharge: 2019-05-30 | Disposition: A | Discharge status: Home / Self Care | Payer: Federal, State, Local not specified - PPO | Source: Ambulatory Visit | Attending: Family Medicine | Admitting: Family Medicine

## 2019-05-30 DIAGNOSIS — R1011 Right upper quadrant pain: Secondary | ICD-10-CM | POA: Diagnosis not present

## 2019-06-15 DIAGNOSIS — L309 Dermatitis, unspecified: Secondary | ICD-10-CM | POA: Diagnosis not present

## 2019-06-16 ENCOUNTER — Other Ambulatory Visit: Payer: Self-pay

## 2019-06-16 ENCOUNTER — Ambulatory Visit
Admission: RE | Admit: 2019-06-16 | Discharge: 2019-06-16 | Disposition: A | Discharge status: Home / Self Care | Payer: Federal, State, Local not specified - PPO | Source: Ambulatory Visit | Attending: Family Medicine | Admitting: Family Medicine

## 2019-06-16 ENCOUNTER — Other Ambulatory Visit: Payer: Self-pay | Admitting: Family Medicine

## 2019-06-16 DIAGNOSIS — R1031 Right lower quadrant pain: Secondary | ICD-10-CM

## 2019-06-16 DIAGNOSIS — I10 Essential (primary) hypertension: Secondary | ICD-10-CM | POA: Diagnosis not present

## 2019-06-21 ENCOUNTER — Other Ambulatory Visit: Payer: Self-pay

## 2019-06-21 ENCOUNTER — Ambulatory Visit
Admission: RE | Admit: 2019-06-21 | Discharge: 2019-06-21 | Disposition: A | Discharge status: Home / Self Care | Payer: Federal, State, Local not specified - PPO | Source: Ambulatory Visit | Attending: Family Medicine | Admitting: Family Medicine

## 2019-06-21 DIAGNOSIS — R1031 Right lower quadrant pain: Secondary | ICD-10-CM

## 2019-06-21 MED ORDER — IOPAMIDOL (ISOVUE-300) INJECTION 61%
100.0000 mL | Freq: Once | INTRAVENOUS | Status: AC | PRN
  Administered 2019-06-21: 100 mL via INTRAVENOUS

## 2019-06-25 ENCOUNTER — Ambulatory Visit: Payer: Federal, State, Local not specified - PPO | Attending: Internal Medicine

## 2019-06-25 DIAGNOSIS — Z23 Encounter for immunization: Secondary | ICD-10-CM | POA: Insufficient documentation

## 2019-06-25 NOTE — Progress Notes (Signed)
   Covid-19 Vaccination Clinic  Name:  Peter Ortiz    MRN: [id number] DOB: 02/15/1966  06/25/2019  [name] was observed post Covid-19 immunization for 15 minutes without incident. He was provided with Vaccine Information Sheet and instruction to access the V-Safe system.   [name] was instructed to call 911 with any severe reactions post vaccine: Marland Kitchen Difficulty breathing  . Swelling of face and throat  . A fast heartbeat  . A bad rash all over body  . Dizziness and weakness   Immunizations Administered    Name Date Dose VIS Date Route   Pfizer COVID-19 Vaccine 06/25/2019  3:44 PM 0.3 mL 04/01/2019 Intramuscular   Manufacturer: [employer], [employer]   Lot: [id number]   NDC: [address]-3

## 2019-07-16 ENCOUNTER — Ambulatory Visit: Payer: Federal, State, Local not specified - PPO | Attending: Internal Medicine

## 2019-07-16 DIAGNOSIS — Z23 Encounter for immunization: Secondary | ICD-10-CM

## 2019-07-16 NOTE — Progress Notes (Signed)
   Covid-19 Vaccination Clinic  Name:  Peter Ortiz    MRN: [id number] DOB: 1965/12/23  07/16/2019  [name] was observed post Covid-19 immunization for 15 minutes without incident. He was provided with Vaccine Information Sheet and instruction to access the V-Safe system.   [name] was instructed to call 911 with any severe reactions post vaccine: Marland Kitchen Difficulty breathing  . Swelling of face and throat  . A fast heartbeat  . A bad rash all over body  . Dizziness and weakness   Immunizations Administered    Name Date Dose VIS Date Route   Pfizer COVID-19 Vaccine 07/16/2019  4:10 PM 0.3 mL 04/01/2019 Intramuscular   Manufacturer: [employer], [employer]   Lot: [id number]   NDC: [address]-7

## 2019-08-25 DIAGNOSIS — A6000 Herpesviral infection of urogenital system, unspecified: Secondary | ICD-10-CM | POA: Diagnosis not present

## 2019-08-25 DIAGNOSIS — Z0000 Encounter for general adult medical examination without abnormal findings: Secondary | ICD-10-CM | POA: Diagnosis not present

## 2019-08-25 DIAGNOSIS — Z125 Encounter for screening for malignant neoplasm of prostate: Secondary | ICD-10-CM | POA: Diagnosis not present

## 2019-08-25 DIAGNOSIS — I10 Essential (primary) hypertension: Secondary | ICD-10-CM | POA: Diagnosis not present

## 2019-08-25 DIAGNOSIS — E782 Mixed hyperlipidemia: Secondary | ICD-10-CM | POA: Diagnosis not present

## 2019-08-25 DIAGNOSIS — N529 Male erectile dysfunction, unspecified: Secondary | ICD-10-CM | POA: Diagnosis not present

## 2019-09-27 DIAGNOSIS — I10 Essential (primary) hypertension: Secondary | ICD-10-CM | POA: Diagnosis not present

## 2019-09-27 DIAGNOSIS — M533 Sacrococcygeal disorders, not elsewhere classified: Secondary | ICD-10-CM | POA: Diagnosis not present

## 2019-10-14 DIAGNOSIS — R42 Dizziness and giddiness: Secondary | ICD-10-CM | POA: Diagnosis not present

## 2019-10-14 DIAGNOSIS — I10 Essential (primary) hypertension: Secondary | ICD-10-CM | POA: Diagnosis not present

## 2019-10-17 ENCOUNTER — Other Ambulatory Visit: Payer: Self-pay

## 2019-10-17 ENCOUNTER — Encounter: Payer: Self-pay | Admitting: Allergy

## 2019-10-17 ENCOUNTER — Ambulatory Visit: Payer: Federal, State, Local not specified - PPO | Admitting: Allergy

## 2019-10-17 VITALS — BP 124/80 | HR 75 | Temp 97.9°F | Resp 18 | Wt 190.2 lb

## 2019-10-17 DIAGNOSIS — R053 Chronic cough: Secondary | ICD-10-CM

## 2019-10-17 DIAGNOSIS — R05 Cough: Secondary | ICD-10-CM

## 2019-10-17 DIAGNOSIS — J310 Chronic rhinitis: Secondary | ICD-10-CM

## 2019-10-17 NOTE — Progress Notes (Signed)
Follow Up Note  RE: KORTEZ MURTAGH MRN: [id number] DOB: 1966/03/08 Date of Office Visit: 10/17/2019  Referring provider: Tally Joe, MD Primary care provider: Tally Joe, MD  Chief Complaint: Follow-up and Allergic Rhinitis  (some sinus pressure/headaches)  History of Present Illness: I had the pleasure of seeing Carloyn Manner for a follow up visit at the [facility] on 10/17/2019. He is a 54 y.o. male, who is being followed for chronic rhinitis, cough, epistaxis. His previous allergy office visit was on 05/04/2019 with Dr. Nunzio Cobbs. Today is a regular follow up visit.  Chronic rhinitis Patient has been having issues with sinus pressure and headaches for the past 2 weeks. No fevers or chills.  There is some clear drainage associated with this.  He had some dizziness as well.  Went to see PCP and was given some meclizine for the dizziness which resolved.   Currently using azelastine 1 spray once a day with unknown benefit.  Patient used to be on Flonase but not anymore.  Not taking any allergy medications.  Tried carbinoxamine which caused dizziness as well so stopped taking it.   No previous ENT evaluation.   Cough Resolved.  Epistaxis Occasionally.   Assessment and Plan: Benji is a 54 y.o. male with: Chronic rhinitis Past history - negative skin testing 2021. Interim history - intolerant of carbinoxamine. Sinus pressure, headaches and slight rhinorrhea for 2 weeks. Denies any fevers/chills. No previous ENT evaluation. Saw PCP for dizziness which resolved with meclizine.   May use over the counter decongest daily for the next 3-5 days such as Zyrtec-D or Allegra-D. Monitor blood pressure.   Continue with azelastine 1-2 sprays per nostril twice a day as needed for drainage.  Start Xhance 1 spray per nostril twice a day for nasal congestion. Sample given.   If symptoms not improving recommend ENT referral next.   Cough,  persistent Resolved.   Return if symptoms worsen or fail to improve.  Diagnostics: None.  Medication List:  Current Outpatient Medications  Medication Sig Dispense Refill  . atorvastatin (LIPITOR) 80 MG tablet Take 80 mg by mouth daily.     Marland Kitchen azelastine (ASTELIN) 0.1 % nasal spray 1 spray per nostril 1-2 times a day as needed 30 mL 5  . Multiple Vitamin (MULTIVITAMIN WITH MINERALS) TABS tablet Take 1 tablet by mouth daily.    Marland Kitchen olmesartan (BENICAR) 20 MG tablet Take 20 mg by mouth daily.    Marland Kitchen omega-3 acid ethyl esters (LOVAZA) 1 G capsule Take 1 g by mouth daily.    . valACYclovir (VALTREX) 1000 MG tablet Take 1,000 mg by mouth as needed.   1   No current facility-administered medications for this visit.   Allergies: Allergies  Allergen Reactions  . Sulfamethoxazole Rash   I reviewed his past medical history, social history, family history, and environmental history and no significant changes have been reported from his previous visit.  Review of Systems  Constitutional: Negative for appetite change, chills, fever and unexpected weight change.  HENT: Positive for congestion, rhinorrhea and sinus pressure.   Eyes: Negative for itching.  Respiratory: Negative for cough, chest tightness, shortness of breath and wheezing.   Gastrointestinal: Negative for abdominal pain.  Skin: Negative for rash.  Allergic/Immunologic: Negative for environmental allergies.  Neurological: Positive for headaches.   Objective: BP 124/80 (BP Location: Right Arm, Patient Position: Sitting, Cuff Size: Large)   Pulse 75   Temp 97.9 F (36.6 C) (Temporal)   Resp  18   Wt 190 lb 3.2 oz (86.3 kg)   SpO2 98%   BMI 28.83 kg/m  Body mass index is 28.83 kg/m. Physical Exam Vitals and nursing note reviewed.  Constitutional:      Appearance: Normal appearance. He is well-developed.  HENT:     Head: Normocephalic and atraumatic.     Right Ear: Tympanic membrane and external ear normal.     Left Ear:  Tympanic membrane and external ear normal.     Nose: Nose normal.     Mouth/Throat:     Mouth: Mucous membranes are moist.     Pharynx: Oropharynx is clear.  Eyes:     Conjunctiva/sclera: Conjunctivae normal.  Cardiovascular:     Rate and Rhythm: Normal rate and regular rhythm.     Heart sounds: Normal heart sounds. No murmur heard.   Pulmonary:     Effort: Pulmonary effort is normal.     Breath sounds: Normal breath sounds. No wheezing, rhonchi or rales.  Musculoskeletal:     Cervical back: Neck supple.  Skin:    General: Skin is warm.     Findings: No rash.  Neurological:     Mental Status: He is alert and oriented to person, place, and time.  Psychiatric:        Behavior: Behavior normal.    Previous notes and tests were reviewed. The plan was reviewed with the patient/family, and all questions/concerned were addressed.  It was my pleasure to see Acey today and participate in his care. Please feel free to contact me with any questions or concerns.  Sincerely,  Wyline Mood, DO Allergy & Immunology  [facility] office: [phone] [facility] office: [phone] [facility] office: [phone]

## 2019-10-17 NOTE — Patient Instructions (Addendum)
May use over the counter decongest daily for the next 3-5 days such as Zyrtec-D or Allegra-D.  Continue with azelastine 1-2 sprays per nostril twice a day as needed for drainage. Start Xhance 1 spray per nostril twice a day for nasal congestion. Sample given.   If symptoms not improving recommend ENT referral next.  Monitor your blood pressure while on the decongestants.  Follow up as needed.

## 2019-10-17 NOTE — Assessment & Plan Note (Addendum)
Past history - negative skin testing 2021. Interim history - intolerant of carbinoxamine. Sinus pressure, headaches and slight rhinorrhea for 2 weeks. Denies any fevers/chills. No previous ENT evaluation. Saw PCP for dizziness which resolved with meclizine.   May use over the counter decongest daily for the next 3-5 days such as Zyrtec-D or Allegra-D. Monitor blood pressure.   Continue with azelastine 1-2 sprays per nostril twice a day as needed for drainage.  Start Xhance 1 spray per nostril twice a day for nasal congestion. Sample given.   If symptoms not improving recommend ENT referral next.

## 2019-10-17 NOTE — Assessment & Plan Note (Signed)
Resolved

## 2019-10-19 ENCOUNTER — Encounter (HOSPITAL_BASED_OUTPATIENT_CLINIC_OR_DEPARTMENT_OTHER): Payer: Self-pay | Admitting: *Deleted

## 2019-10-19 ENCOUNTER — Emergency Department (HOSPITAL_BASED_OUTPATIENT_CLINIC_OR_DEPARTMENT_OTHER): Payer: Federal, State, Local not specified - PPO

## 2019-10-19 ENCOUNTER — Emergency Department (HOSPITAL_BASED_OUTPATIENT_CLINIC_OR_DEPARTMENT_OTHER)
Admission: EM | Admit: 2019-10-19 | Discharge: 2019-10-19 | Disposition: A | Discharge status: Home / Self Care | Payer: Federal, State, Local not specified - PPO | Attending: Emergency Medicine | Admitting: Emergency Medicine

## 2019-10-19 ENCOUNTER — Other Ambulatory Visit: Payer: Self-pay

## 2019-10-19 DIAGNOSIS — I10 Essential (primary) hypertension: Secondary | ICD-10-CM | POA: Insufficient documentation

## 2019-10-19 DIAGNOSIS — Z79899 Other long term (current) drug therapy: Secondary | ICD-10-CM | POA: Insufficient documentation

## 2019-10-19 DIAGNOSIS — Z882 Allergy status to sulfonamides status: Secondary | ICD-10-CM | POA: Diagnosis not present

## 2019-10-19 DIAGNOSIS — R55 Syncope and collapse: Secondary | ICD-10-CM

## 2019-10-19 DIAGNOSIS — R519 Headache, unspecified: Secondary | ICD-10-CM | POA: Diagnosis not present

## 2019-10-19 HISTORY — DX: Essential (primary) hypertension: I10

## 2019-10-19 LAB — CBC WITH DIFFERENTIAL/PLATELET
Abs Immature Granulocytes: 0.01 10*3/uL (ref 0.00–0.07)
Basophils Absolute: 0 10*3/uL (ref 0.0–0.1)
Basophils Relative: 1 %
Eosinophils Absolute: 0 10*3/uL (ref 0.0–0.5)
Eosinophils Relative: 0 %
HCT: 42.8 % (ref 39.0–52.0)
Hemoglobin: 14.6 g/dL (ref 13.0–17.0)
Immature Granulocytes: 0 %
Lymphocytes Relative: 34 %
Lymphs Abs: 1.7 10*3/uL (ref 0.7–4.0)
MCH: 30.2 pg (ref 26.0–34.0)
MCHC: 34.1 g/dL (ref 30.0–36.0)
MCV: 88.4 fL (ref 80.0–100.0)
Monocytes Absolute: 0.5 10*3/uL (ref 0.1–1.0)
Monocytes Relative: 10 %
Neutro Abs: 2.7 10*3/uL (ref 1.7–7.7)
Neutrophils Relative %: 55 %
Platelets: 211 10*3/uL (ref 150–400)
RBC: 4.84 MIL/uL (ref 4.22–5.81)
RDW: 12.1 % (ref 11.5–15.5)
WBC: 4.9 10*3/uL (ref 4.0–10.5)
nRBC: 0 % (ref 0.0–0.2)

## 2019-10-19 LAB — BASIC METABOLIC PANEL
Anion gap: 9 (ref 5–15)
BUN: 16 mg/dL (ref 6–20)
CO2: 25 mmol/L (ref 22–32)
Calcium: 9.2 mg/dL (ref 8.9–10.3)
Chloride: 106 mmol/L (ref 98–111)
Creatinine, Ser: 0.94 mg/dL (ref 0.61–1.24)
GFR calc Af Amer: >60 mL/min (ref >60)
GFR calc non Af Amer: >60 mL/min (ref >60)
Glucose, Bld: 129 mg/dL — ABNORMAL HIGH (ref 70–99)
Potassium: 3.6 mmol/L (ref 3.5–5.1)
Sodium: 140 mmol/L (ref 135–145)

## 2019-10-19 NOTE — Discharge Instructions (Addendum)
You are seen in the emergency department for evaluation of headache, dizziness, and a fainting spell.  You had blood work EKG and a CAT scan of your head that did not show any serious findings.  Please continue your regular medications and stay well-hydrated.  Follow-up with your doctor.  Return to the emergency department for any worsening or concerning symptoms.

## 2019-10-19 NOTE — ED Provider Notes (Signed)
[facility] EMERGENCY DEPARTMENT Provider Note   CSN: [id number] Arrival date & time: 10/19/19  9675     History Chief Complaint  Patient presents with  . Headache    Peter Ortiz is a 54 y.o. male.  He has a history of hypertension high cholesterol.  He said he has been troubled by a pressure headache frontal and occipital on and off for 2 weeks.  He saw his primary care doctor a few days ago because it started being associated with some dizziness.  His doctor told him he had vertigo and put him on some medication.  2 days ago his blood pressure was high so his doctor told him to double his Benicar.  He took a double dose yesterday.  Today while sitting in a chair he took his blood pressure and it was 141/85.  While sitting he became very hot and felt unwell and rechecked his blood pressure and found to be 60/40.  After that he woke up on the floor.  Denies any injury from that.  Thinks he might of passed out for a few minutes.  No prior history of syncope.  No blurry vision double vision numbness weakness unsteady gait chest pain shortness of breath.  No vomiting or diarrhea.  No incontinence of urine.  No reported seizure activity.  The history is provided by the patient.  Headache Pain location:  Frontal and occipital Quality:  Dull Radiates to:  Does not radiate Severity currently:  5/10 Onset quality:  Gradual Duration:  2 weeks Timing:  Intermittent Progression:  Unchanged Chronicity:  New Worsened by:  Nothing Associated symptoms: dizziness and syncope   Associated symptoms: no abdominal pain, no blurred vision, no cough, no diarrhea, no ear pain, no eye pain, no fever, no focal weakness, no nausea, no neck pain, no neck stiffness, no seizures, no sore throat, no visual change and no vomiting   Syncope:    Witnessed: no     Suspicion of head trauma:  No      Past Medical History:  Diagnosis Date  . Hypertension   . Tendinitis 1990    Patient Active  Problem List   Diagnosis Date Noted  . Chronic rhinitis 05/04/2019  . Cough, persistent 05/04/2019  . Epistaxis 05/04/2019    History reviewed. No pertinent surgical history.     Family History  Problem Relation Age of Onset  . Asthma Maternal Uncle   . Allergic rhinitis Neg Hx   . Angioedema Neg Hx   . Atopy Neg Hx   . Eczema Neg Hx   . Immunodeficiency Neg Hx   . Urticaria Neg Hx     Social History   Tobacco Use  . Smoking status: Never Smoker  . Smokeless tobacco: Never Used  Vaping Use  . Vaping Use: Never used  Substance Use Topics  . Alcohol use: Not on file  . Drug use: Not on file    Home Medications Prior to Admission medications   Medication Sig Start Date End Date Taking? Authorizing Provider  atorvastatin (LIPITOR) 80 MG tablet Take 80 mg by mouth daily.  01/01/14   [provider]  azelastine (ASTELIN) 0.1 % nasal spray 1 spray per nostril 1-2 times a day as needed 05/04/19   Bobbitt, Heywood Iles, MD  Multiple Vitamin (MULTIVITAMIN WITH MINERALS) TABS tablet Take 1 tablet by mouth daily.    [provider]  olmesartan (BENICAR) 20 MG tablet Take 20 mg by mouth daily. 09/23/19  [provider]  omega-3 acid ethyl esters (LOVAZA) 1 G capsule Take 1 g by mouth daily.    [provider]  valACYclovir (VALTREX) 1000 MG tablet Take 1,000 mg by mouth as needed.  08/15/14   [provider]    Allergies    Sulfamethoxazole  Review of Systems   Review of Systems  Constitutional: Negative for fever.  HENT: Negative for ear pain and sore throat.   Eyes: Negative for blurred vision, pain and visual disturbance.  Respiratory: Negative for cough and shortness of breath.   Cardiovascular: Positive for syncope. Negative for chest pain.  Gastrointestinal: Negative for abdominal pain, diarrhea, nausea and vomiting.  Genitourinary: Negative for dysuria.  Musculoskeletal: Negative for neck pain and neck stiffness.  Skin:  Negative for rash.  Neurological: Positive for dizziness, syncope and headaches. Negative for focal weakness and seizures.    Physical Exam Updated Vital Signs BP (!) 163/94 (BP Location: Right Arm)   Pulse 72   Temp 98.3 F (36.8 C)   Resp 16   Ht 5\' 8"  (1.727 m)   Wt 86.2 kg   SpO2 100%   BMI 28.89 kg/m   Physical Exam Vitals and nursing note reviewed.  Constitutional:      Appearance: He is well-developed.  HENT:     Head: Normocephalic and atraumatic.  Eyes:     Conjunctiva/sclera: Conjunctivae normal.  Cardiovascular:     Rate and Rhythm: Normal rate and regular rhythm.     Heart sounds: No murmur heard.   Pulmonary:     Effort: Pulmonary effort is normal. No respiratory distress.     Breath sounds: Normal breath sounds.  Abdominal:     Palpations: Abdomen is soft.     Tenderness: There is no abdominal tenderness.  Musculoskeletal:        General: No deformity or signs of injury. Normal range of motion.     Cervical back: Neck supple.  Skin:    General: Skin is warm and dry.     Capillary Refill: Capillary refill takes less than 2 seconds.  Neurological:     Mental Status: He is alert and oriented to person, place, and time.     GCS: GCS eye subscore is 4. GCS verbal subscore is 5. GCS motor subscore is 6.     Cranial Nerves: No cranial nerve deficit.     Sensory: No sensory deficit.     Motor: No weakness.     Gait: Gait normal.     ED Results / Procedures / Treatments   Labs (all labs ordered are listed, but only abnormal results are displayed) Labs Reviewed  BASIC METABOLIC PANEL - Abnormal; Notable for the following components:      Result Value   Glucose, Bld 129 (*)    All other components within normal limits  CBC WITH DIFFERENTIAL/PLATELET    EKG EKG Interpretation  Date/Time:  Wednesday October 19 2019 07:49:25 EDT Ventricular Rate:  65 PR Interval:    QRS Duration: 102 QT Interval:  399 QTC Calculation: 415 R Axis:   66 Text  Interpretation: Sinus rhythm RSR' in V1 or V2, right VCD or RVH Minimal ST elevation, anterior leads No old tracing to compare Confirmed by 06-18-1978 [phone]) on 10/19/2019 7:53:47 AM   Radiology CT Head Wo Contrast  Result Date: 10/19/2019 CLINICAL DATA:  Syncope, simple, normal neuro exam; dizziness, nonspecific. Additional history provided: Patient reports headaches for 2 weeks, syncopal episode today, hitting right side of  head. EXAM: CT HEAD WITHOUT CONTRAST TECHNIQUE: Contiguous axial images were obtained from the base of the skull through the vertex without intravenous contrast. COMPARISON:  [facility] head 11/02/2007. FINDINGS: Brain: Cerebral volume is normal. There is no acute intracranial hemorrhage. No demarcated cortical infarct. No extra-axial fluid collection. No evidence of intracranial mass. No midline shift. Vascular: No hyperdense vessel. Skull: Normal. Negative for fracture or focal lesion. Sinuses/Orbits: Visualized orbits show no acute finding. Small left sphenoid sinus mucous retention cyst. Minimal ethmoid sinus mucosal thickening at the imaged levels. No significant mastoid effusion. IMPRESSION: Unremarkable noncontrast CT appearance of the brain. No evidence of acute intracranial abnormality. Mild ethmoid sinus mucosal thickening. Small left sphenoid sinus mucous retention cyst. Electronically Signed   By: Jackey Loge DO   On: 10/19/2019 08:28    Procedures Procedures (including critical care time)  Medications Ordered in ED Medications - No data to display  ED Course  I have reviewed the triage vital signs and the nursing notes.  Pertinent labs & imaging results that were available during my care of the patient were reviewed by me and considered in my medical decision making (see chart for details).  Clinical Course as of Oct 18 2004  Wed Oct 19, 2019  0347 Initial heart rate put in as 43.  Discussed with nurse he thinks that might have been a typing error, rechecked  pulse and was 72.   [MB]  [id number] Reviewed results with patient.  No serious etiology identified for his symptoms.  Recommended that he follow back up with his primary care doctor for further evaluation.  Return instructions discussed.   [MB]    Clinical Course User Index [MB] Terrilee Files, MD   MDM Rules/Calculators/A&P                         This patient complains of headache dizziness low blood pressure syncope; this involves an extensive number of treatment Options and is a complaint that carries with it a high risk of complications and Morbidity. The differential includes generalized headache, tumor, bleed, anemia, arrhythmia, metabolic derangement, seizure  I ordered, reviewed and interpreted labs, which included CBC with normal white count normal hemoglobin, chemistries with normal renal function mildly elevated glucose  I ordered imaging studies which included head CT and I independently    visualized and interpreted imaging which showed no acute findings Previous records obtained and reviewed in epic, no pertinent information  After the interventions stated above, I reevaluated the patient and found patient to be hemodynamically neurologically stable.  No arrhythmias while on cardiac monitoring.  Reviewed his labs and imaging with him.  He is comfortable following up with his primary care doctor.  Recommend he continues to monitor his blood pressure.  Return instructions discussed.   Final Clinical Impression(s) / ED Diagnoses Final diagnoses:  Acute nonintractable headache, unspecified headache type  Syncope, unspecified syncope type    Rx / DC Orders ED Discharge Orders    None       Terrilee Files, MD 10/19/19 2008

## 2019-10-19 NOTE — ED Triage Notes (Addendum)
C/o headache x 2 weeks ago  Is on sinus meds  States this am took bp and it was 141/85,  Felt hot retook pressure 60/40's  States he the passed out fell out of chair  At present a&o.  Ambulatory   Denies chest pain no sob

## 2019-10-20 DIAGNOSIS — R55 Syncope and collapse: Secondary | ICD-10-CM | POA: Diagnosis not present

## 2019-10-20 DIAGNOSIS — J0100 Acute maxillary sinusitis, unspecified: Secondary | ICD-10-CM | POA: Diagnosis not present

## 2019-10-20 DIAGNOSIS — L98491 Non-pressure chronic ulcer of skin of other sites limited to breakdown of skin: Secondary | ICD-10-CM | POA: Diagnosis not present

## 2019-10-20 DIAGNOSIS — B359 Dermatophytosis, unspecified: Secondary | ICD-10-CM | POA: Diagnosis not present

## 2019-11-15 DIAGNOSIS — J329 Chronic sinusitis, unspecified: Secondary | ICD-10-CM | POA: Diagnosis not present

## 2019-11-15 DIAGNOSIS — R52 Pain, unspecified: Secondary | ICD-10-CM | POA: Diagnosis not present

## 2019-11-15 DIAGNOSIS — R5383 Other fatigue: Secondary | ICD-10-CM | POA: Diagnosis not present

## 2019-11-15 DIAGNOSIS — Z03818 Encounter for observation for suspected exposure to other biological agents ruled out: Secondary | ICD-10-CM | POA: Diagnosis not present

## 2019-11-17 ENCOUNTER — Other Ambulatory Visit: Payer: Self-pay | Admitting: Family Medicine

## 2019-11-17 ENCOUNTER — Other Ambulatory Visit: Payer: Self-pay

## 2019-11-17 ENCOUNTER — Ambulatory Visit
Admission: RE | Admit: 2019-11-17 | Discharge: 2019-11-17 | Disposition: A | Discharge status: Home / Self Care | Payer: Federal, State, Local not specified - PPO | Source: Ambulatory Visit | Attending: Family Medicine | Admitting: Family Medicine

## 2019-11-17 DIAGNOSIS — M533 Sacrococcygeal disorders, not elsewhere classified: Secondary | ICD-10-CM | POA: Diagnosis not present

## 2019-11-17 DIAGNOSIS — I878 Other specified disorders of veins: Secondary | ICD-10-CM | POA: Diagnosis not present

## 2019-11-17 DIAGNOSIS — M47816 Spondylosis without myelopathy or radiculopathy, lumbar region: Secondary | ICD-10-CM | POA: Diagnosis not present

## 2019-11-29 DIAGNOSIS — F419 Anxiety disorder, unspecified: Secondary | ICD-10-CM | POA: Diagnosis not present

## 2019-11-29 DIAGNOSIS — G4700 Insomnia, unspecified: Secondary | ICD-10-CM | POA: Diagnosis not present

## 2019-12-02 DIAGNOSIS — M533 Sacrococcygeal disorders, not elsewhere classified: Secondary | ICD-10-CM | POA: Diagnosis not present

## 2019-12-02 DIAGNOSIS — J3489 Other specified disorders of nose and nasal sinuses: Secondary | ICD-10-CM | POA: Diagnosis not present

## 2019-12-02 DIAGNOSIS — F419 Anxiety disorder, unspecified: Secondary | ICD-10-CM | POA: Diagnosis not present

## 2019-12-02 DIAGNOSIS — G4700 Insomnia, unspecified: Secondary | ICD-10-CM | POA: Diagnosis not present

## 2019-12-14 DIAGNOSIS — R0683 Snoring: Secondary | ICD-10-CM | POA: Diagnosis not present

## 2019-12-14 DIAGNOSIS — J341 Cyst and mucocele of nose and nasal sinus: Secondary | ICD-10-CM | POA: Diagnosis not present

## 2019-12-21 ENCOUNTER — Emergency Department (HOSPITAL_BASED_OUTPATIENT_CLINIC_OR_DEPARTMENT_OTHER): Payer: Federal, State, Local not specified - PPO

## 2019-12-21 ENCOUNTER — Encounter (HOSPITAL_BASED_OUTPATIENT_CLINIC_OR_DEPARTMENT_OTHER): Payer: Self-pay | Admitting: *Deleted

## 2019-12-21 ENCOUNTER — Emergency Department (HOSPITAL_BASED_OUTPATIENT_CLINIC_OR_DEPARTMENT_OTHER)
Admission: EM | Admit: 2019-12-21 | Discharge: 2019-12-21 | Disposition: A | Discharge status: Home / Self Care | Payer: Federal, State, Local not specified - PPO | Attending: Emergency Medicine | Admitting: Emergency Medicine

## 2019-12-21 ENCOUNTER — Other Ambulatory Visit: Payer: Self-pay

## 2019-12-21 DIAGNOSIS — R531 Weakness: Secondary | ICD-10-CM | POA: Diagnosis not present

## 2019-12-21 DIAGNOSIS — R5383 Other fatigue: Secondary | ICD-10-CM | POA: Diagnosis not present

## 2019-12-21 DIAGNOSIS — Z79899 Other long term (current) drug therapy: Secondary | ICD-10-CM | POA: Diagnosis not present

## 2019-12-21 DIAGNOSIS — R42 Dizziness and giddiness: Secondary | ICD-10-CM | POA: Diagnosis not present

## 2019-12-21 DIAGNOSIS — G4700 Insomnia, unspecified: Secondary | ICD-10-CM | POA: Diagnosis not present

## 2019-12-21 DIAGNOSIS — R55 Syncope and collapse: Secondary | ICD-10-CM | POA: Insufficient documentation

## 2019-12-21 DIAGNOSIS — R002 Palpitations: Secondary | ICD-10-CM | POA: Diagnosis not present

## 2019-12-21 DIAGNOSIS — I10 Essential (primary) hypertension: Secondary | ICD-10-CM | POA: Insufficient documentation

## 2019-12-21 LAB — COMPREHENSIVE METABOLIC PANEL
ALT: 42 U/L (ref 0–44)
AST: 34 U/L (ref 15–41)
Albumin: 4.4 g/dL (ref 3.5–5.0)
Alkaline Phosphatase: 76 U/L (ref 38–126)
Anion gap: 8 (ref 5–15)
BUN: 14 mg/dL (ref 6–20)
CO2: 26 mmol/L (ref 22–32)
Calcium: 9 mg/dL (ref 8.9–10.3)
Chloride: 102 mmol/L (ref 98–111)
Creatinine, Ser: 0.88 mg/dL (ref 0.61–1.24)
GFR calc Af Amer: >60 mL/min (ref >60)
GFR calc non Af Amer: >60 mL/min (ref >60)
Glucose, Bld: 132 mg/dL — ABNORMAL HIGH (ref 70–99)
Potassium: 3.7 mmol/L (ref 3.5–5.1)
Sodium: 136 mmol/L (ref 135–145)
Total Bilirubin: 0.9 mg/dL (ref 0.3–1.2)
Total Protein: 7.6 g/dL (ref 6.5–8.1)

## 2019-12-21 LAB — CBC WITH DIFFERENTIAL/PLATELET
Abs Immature Granulocytes: 0 10*3/uL (ref 0.00–0.07)
Basophils Absolute: 0 10*3/uL (ref 0.0–0.1)
Basophils Relative: 1 %
Eosinophils Absolute: 0 10*3/uL (ref 0.0–0.5)
Eosinophils Relative: 1 %
HCT: 41 % (ref 39.0–52.0)
Hemoglobin: 13.9 g/dL (ref 13.0–17.0)
Immature Granulocytes: 0 %
Lymphocytes Relative: 58 %
Lymphs Abs: 2.2 10*3/uL (ref 0.7–4.0)
MCH: 30 pg (ref 26.0–34.0)
MCHC: 33.9 g/dL (ref 30.0–36.0)
MCV: 88.6 fL (ref 80.0–100.0)
Monocytes Absolute: 0.4 10*3/uL (ref 0.1–1.0)
Monocytes Relative: 11 %
Neutro Abs: 1.1 10*3/uL — ABNORMAL LOW (ref 1.7–7.7)
Neutrophils Relative %: 29 %
Platelets: 224 10*3/uL (ref 150–400)
RBC: 4.63 MIL/uL (ref 4.22–5.81)
RDW: 12.2 % (ref 11.5–15.5)
WBC: 3.7 10*3/uL — ABNORMAL LOW (ref 4.0–10.5)
nRBC: 0 % (ref 0.0–0.2)

## 2019-12-21 LAB — TROPONIN I (HIGH SENSITIVITY)
Troponin I (High Sensitivity): 4 ng/L (ref <18)
Troponin I (High Sensitivity): 5 ng/L (ref <18)

## 2019-12-21 NOTE — ED Provider Notes (Signed)
[facility] EMERGENCY DEPARTMENT Provider Note   CSN: [id number] Arrival date & time: 12/21/19  1455     History Chief Complaint  Patient presents with  . Palpitations    Peter Ortiz is a 54 y.o. male.  He said he is struggling with insomnia and his dog cat on on Lunesta for the last 2 weeks.  It is causing him some daytime sleepiness and today at work he felt very lightheaded like he was going to pass out and his heart was racing.  He is not sure if the medication side effect or something else happened.  It was associated with some achiness in his shoulders and some weakness in his legs.  No numbness or focal weakness.  No current chest pain.  He saw ENT recently and is set up for a sleep study to evaluate for apnea.  The history is provided by the patient.  Palpitations Palpitations quality:  Fast Onset quality:  Sudden Progression:  Resolved Chronicity:  New Relieved by: sitting down. Associated symptoms: dizziness and near-syncope   Associated symptoms: no back pain, no chest pain, no cough, no leg pain, no lower extremity edema, no nausea and no shortness of breath   Risk factors: no hx of DVT        Past Medical History:  Diagnosis Date  . Hypertension   . Tendinitis 1990    Patient Active Problem List   Diagnosis Date Noted  . Chronic rhinitis 05/04/2019  . Cough, persistent 05/04/2019  . Epistaxis 05/04/2019    History reviewed. No pertinent surgical history.     Family History  Problem Relation Age of Onset  . Asthma Maternal Uncle   . Allergic rhinitis Neg Hx   . Angioedema Neg Hx   . Atopy Neg Hx   . Eczema Neg Hx   . Immunodeficiency Neg Hx   . Urticaria Neg Hx     Social History   Tobacco Use  . Smoking status: Never Smoker  . Smokeless tobacco: Never Used  Vaping Use  . Vaping Use: Never used  Substance Use Topics  . Alcohol use: Not on file  . Drug use: Not on file    Home Medications Prior to Admission medications     Medication Sig Start Date End Date Taking? Authorizing Provider  Eszopiclone 3 MG TABS  12/02/19  Yes [provider]  atorvastatin (LIPITOR) 80 MG tablet Take 80 mg by mouth daily.  01/01/14   [provider]  azelastine (ASTELIN) 0.1 % nasal spray 1 spray per nostril 1-2 times a day as needed 05/04/19   Bobbitt, Heywood Iles, MD  Multiple Vitamin (MULTIVITAMIN WITH MINERALS) TABS tablet Take 1 tablet by mouth daily.    [provider]  olmesartan (BENICAR) 20 MG tablet Take 20 mg by mouth daily. 09/23/19   [provider]  omega-3 acid ethyl esters (LOVAZA) 1 G capsule Take 1 g by mouth daily.    [provider]  valACYclovir (VALTREX) 1000 MG tablet Take 1,000 mg by mouth as needed.  08/15/14   [provider]    Allergies    Sulfamethoxazole  Review of Systems   Review of Systems  Constitutional: Positive for fatigue. Negative for fever.  HENT: Negative for sore throat.   Eyes: Negative for visual disturbance.  Respiratory: Negative for cough and shortness of breath.   Cardiovascular: Positive for palpitations and near-syncope. Negative for chest pain.  Gastrointestinal: Negative for abdominal pain and nausea.  Genitourinary:  Negative for dysuria.  Musculoskeletal: Negative for back pain.  Skin: Negative for rash.  Neurological: Positive for dizziness and light-headedness. Negative for speech difficulty.    Physical Exam Updated Vital Signs BP (!) 168/91   Pulse 71   Temp 98.1 F (36.7 C) (Oral)   Resp 18   Ht 5\' 8"  (1.727 m)   Wt 84.4 kg   SpO2 100%   BMI 28.28 kg/m   Physical Exam Vitals and nursing note reviewed.  Constitutional:      Appearance: Normal appearance. He is well-developed.  HENT:     Head: Normocephalic and atraumatic.     Nose: Nose normal.     Mouth/Throat:     Mouth: Mucous membranes are moist.     Pharynx: Oropharynx is clear.  Eyes:     Extraocular Movements: Extraocular movements intact.      Conjunctiva/sclera: Conjunctivae normal.     Pupils: Pupils are equal, round, and reactive to light.  Cardiovascular:     Rate and Rhythm: Normal rate and regular rhythm.     Heart sounds: No murmur heard.   Pulmonary:     Effort: Pulmonary effort is normal. No respiratory distress.     Breath sounds: Normal breath sounds.  Abdominal:     Palpations: Abdomen is soft.     Tenderness: There is no abdominal tenderness. There is no guarding or rebound.  Musculoskeletal:        General: No deformity or signs of injury. Normal range of motion.     Cervical back: Neck supple.     Right lower leg: No edema.     Left lower leg: No edema.  Skin:    General: Skin is warm and dry.     Capillary Refill: Capillary refill takes less than 2 seconds.  Neurological:     General: No focal deficit present.     Mental Status: He is alert and oriented to person, place, and time.     Sensory: No sensory deficit.     Motor: No weakness.     Gait: Gait normal.     ED Results / Procedures / Treatments   Labs (all labs ordered are listed, but only abnormal results are displayed) Labs Reviewed  CBC WITH DIFFERENTIAL/PLATELET - Abnormal; Notable for the following components:      Result Value   WBC 3.7 (*)    Neutro Abs 1.1 (*)    All other components within normal limits  COMPREHENSIVE METABOLIC PANEL - Abnormal; Notable for the following components:   Glucose, Bld 132 (*)    All other components within normal limits  TROPONIN I (HIGH SENSITIVITY)  TROPONIN I (HIGH SENSITIVITY)    EKG EKG Interpretation  Date/Time:  Wednesday December 21 2019 15:01:40 EDT Ventricular Rate:  66 PR Interval:  148 QRS Duration: 114 QT Interval:  402 QTC Calculation: 421 R Axis:   73 Text Interpretation: Normal sinus rhythm Incomplete right bundle branch block Lateral infarct , age undetermined Abnormal ECG No significant change since prior 6/21 Confirmed by 7/21 [phone]) on 12/21/2019 3:39:52  PM   Radiology DG Chest 2 View  Result Date: 12/21/2019 CLINICAL DATA:  Palpitations x1 day. EXAM: CHEST - 2 VIEW COMPARISON:  None. FINDINGS: There is no evidence of acute infiltrate, pleural effusion or pneumothorax. The heart size and mediastinal contours are within normal limits. The visualized skeletal structures are unremarkable. IMPRESSION: No active cardiopulmonary disease. Electronically Signed   By: 02/20/2020 M.D.   On:  12/21/2019 15:18    Procedures Procedures (including critical care time)  Medications Ordered in ED Medications - No data to display  ED Course  I have reviewed the triage vital signs and the nursing notes.  Pertinent labs & imaging results that were available during my care of the patient were reviewed by me and considered in my medical decision making (see chart for details).    MDM Rules/Calculators/A&P                         This patient complains of weakness palpitations; this involves an extensive number of treatment Options and is a complaint that carries with it a high risk of complications and Morbidity. The differential includes arrhythmia, ACS, pneumonia, metabolic derangement, anemia, medication reaction  I ordered, reviewed and interpreted labs, which included CBC with slightly low white count normal hemoglobin, chemistries normal other than mildly elevated glucose, normal LFTs, delta troponins unremarkable I ordered imaging studies which included chest x-ray and I independently    visualized and interpreted imaging which showed no acute infiltrates Previous records obtained and reviewed in epic, I saw this patient about 2 months ago for headache with unremarkable work-up  After the interventions stated above, I reevaluated the patient and found patient currently to be without symptoms and stable vitals.  I reviewed his work-up with him and he is comfortable with plan with following up with his doctors and following through with a sleep  study.  Recommended that he may want to consider holding his Lunesta to see if this helps with his symptoms.  Return instructions discussed   Final Clinical Impression(s) / ED Diagnoses Final diagnoses:  Palpitations  Generalized weakness  Insomnia, unspecified type    Rx / DC Orders ED Discharge Orders    None       Terrilee Files, MD 12/22/19 1055

## 2019-12-21 NOTE — ED Triage Notes (Addendum)
C/o palpations x 1 day , unable to sleep x 2 weeks, taking lunesta

## 2019-12-21 NOTE — ED Notes (Signed)
Patient transported to X-ray 

## 2019-12-21 NOTE — Discharge Instructions (Addendum)
You were seen in the emergency department for palpitations daytime sleepiness and generalized weakness after starting a new medication a couple of weeks ago.  Daytime sleepiness is definitely a side effect of this medicine.  You had blood work EKG and a chest x-ray that did not show evidence of heart attack.  Please follow-up with your regular doctor.  Consider stopping the medication and seeing if your symptoms improve.  Return the emergency department for any worsening or concerning symptoms

## 2019-12-24 DIAGNOSIS — Z03818 Encounter for observation for suspected exposure to other biological agents ruled out: Secondary | ICD-10-CM | POA: Diagnosis not present

## 2019-12-26 DIAGNOSIS — R197 Diarrhea, unspecified: Secondary | ICD-10-CM | POA: Diagnosis not present

## 2019-12-26 DIAGNOSIS — M791 Myalgia, unspecified site: Secondary | ICD-10-CM | POA: Diagnosis not present

## 2019-12-26 DIAGNOSIS — G4700 Insomnia, unspecified: Secondary | ICD-10-CM | POA: Diagnosis not present

## 2019-12-26 DIAGNOSIS — R5383 Other fatigue: Secondary | ICD-10-CM | POA: Diagnosis not present

## 2019-12-26 DIAGNOSIS — Z03818 Encounter for observation for suspected exposure to other biological agents ruled out: Secondary | ICD-10-CM | POA: Diagnosis not present

## 2019-12-28 ENCOUNTER — Other Ambulatory Visit: Payer: Self-pay

## 2019-12-28 ENCOUNTER — Emergency Department (HOSPITAL_COMMUNITY)
Admission: EM | Admit: 2019-12-28 | Discharge: 2019-12-28 | Disposition: A | Discharge status: Home / Self Care | Payer: Federal, State, Local not specified - PPO | Attending: Emergency Medicine | Admitting: Emergency Medicine

## 2019-12-28 ENCOUNTER — Emergency Department (HOSPITAL_COMMUNITY): Payer: Federal, State, Local not specified - PPO

## 2019-12-28 ENCOUNTER — Encounter (HOSPITAL_COMMUNITY): Payer: Self-pay

## 2019-12-28 DIAGNOSIS — I10 Essential (primary) hypertension: Secondary | ICD-10-CM | POA: Insufficient documentation

## 2019-12-28 DIAGNOSIS — A0472 Enterocolitis due to Clostridium difficile, not specified as recurrent: Secondary | ICD-10-CM | POA: Diagnosis not present

## 2019-12-28 DIAGNOSIS — R509 Fever, unspecified: Secondary | ICD-10-CM | POA: Diagnosis not present

## 2019-12-28 DIAGNOSIS — Z79899 Other long term (current) drug therapy: Secondary | ICD-10-CM | POA: Insufficient documentation

## 2019-12-28 DIAGNOSIS — Z20822 Contact with and (suspected) exposure to covid-19: Secondary | ICD-10-CM | POA: Diagnosis not present

## 2019-12-28 DIAGNOSIS — R05 Cough: Secondary | ICD-10-CM | POA: Diagnosis not present

## 2019-12-28 DIAGNOSIS — J9811 Atelectasis: Secondary | ICD-10-CM | POA: Diagnosis not present

## 2019-12-28 LAB — COMPREHENSIVE METABOLIC PANEL
ALT: 31 U/L (ref 0–44)
AST: 32 U/L (ref 15–41)
Albumin: 3.9 g/dL (ref 3.5–5.0)
Alkaline Phosphatase: 64 U/L (ref 38–126)
Anion gap: 10 (ref 5–15)
BUN: 9 mg/dL (ref 6–20)
CO2: 27 mmol/L (ref 22–32)
Calcium: 8.6 mg/dL — ABNORMAL LOW (ref 8.9–10.3)
Chloride: 102 mmol/L (ref 98–111)
Creatinine, Ser: 1.09 mg/dL (ref 0.61–1.24)
GFR calc Af Amer: >60 mL/min (ref >60)
GFR calc non Af Amer: >60 mL/min (ref >60)
Glucose, Bld: 97 mg/dL (ref 70–99)
Potassium: 4 mmol/L (ref 3.5–5.1)
Sodium: 139 mmol/L (ref 135–145)
Total Bilirubin: 0.8 mg/dL (ref 0.3–1.2)
Total Protein: 7.2 g/dL (ref 6.5–8.1)

## 2019-12-28 LAB — C DIFFICILE QUICK SCREEN W PCR REFLEX
C Diff antigen: POSITIVE — AB
C Diff interpretation: DETECTED
C Diff toxin: POSITIVE — AB

## 2019-12-28 LAB — CBC WITH DIFFERENTIAL/PLATELET
Abs Immature Granulocytes: 0.02 10*3/uL (ref 0.00–0.07)
Basophils Absolute: 0 10*3/uL (ref 0.0–0.1)
Basophils Relative: 0 %
Eosinophils Absolute: 0 10*3/uL (ref 0.0–0.5)
Eosinophils Relative: 1 %
HCT: 40.1 % (ref 39.0–52.0)
Hemoglobin: 13.7 g/dL (ref 13.0–17.0)
Immature Granulocytes: 0 %
Lymphocytes Relative: 36 %
Lymphs Abs: 2.5 10*3/uL (ref 0.7–4.0)
MCH: 30.4 pg (ref 26.0–34.0)
MCHC: 34.2 g/dL (ref 30.0–36.0)
MCV: 88.9 fL (ref 80.0–100.0)
Monocytes Absolute: 1.5 10*3/uL — ABNORMAL HIGH (ref 0.1–1.0)
Monocytes Relative: 21 %
Neutro Abs: 2.8 10*3/uL (ref 1.7–7.7)
Neutrophils Relative %: 42 %
Platelets: 234 10*3/uL (ref 150–400)
RBC: 4.51 MIL/uL (ref 4.22–5.81)
RDW: 12.3 % (ref 11.5–15.5)
WBC: 6.8 10*3/uL (ref 4.0–10.5)
nRBC: 0 % (ref 0.0–0.2)

## 2019-12-28 LAB — MAGNESIUM: Magnesium: 2 mg/dL (ref 1.7–2.4)

## 2019-12-28 LAB — SARS CORONAVIRUS 2 BY RT PCR (HOSPITAL ORDER, PERFORMED IN ~~LOC~~ HOSPITAL LAB): SARS Coronavirus 2: NEGATIVE

## 2019-12-28 MED ORDER — VANCOMYCIN 50 MG/ML ORAL SOLUTION
125.0000 mg | Freq: Once | ORAL | Status: AC
  Administered 2019-12-28: 125 mg via ORAL
  Filled 2019-12-28: qty 2.5

## 2019-12-28 MED ORDER — SODIUM CHLORIDE 0.9 % IV BOLUS
1000.0000 mL | Freq: Once | INTRAVENOUS | Status: AC
  Administered 2019-12-28: 1000 mL via INTRAVENOUS

## 2019-12-28 MED ORDER — ACETAMINOPHEN 325 MG PO TABS
650.0000 mg | ORAL_TABLET | Freq: Once | ORAL | Status: AC | PRN
  Administered 2019-12-28: 650 mg via ORAL
  Filled 2019-12-28 (×2): qty 2

## 2019-12-28 MED ORDER — VANCOMYCIN HCL 10 G IV SOLR: 125.0000 mg | Freq: Once | INTRAVENOUS | Status: DC

## 2019-12-28 MED ORDER — VANCOMYCIN HCL 125 MG PO CAPS: 125.0000 mg | ORAL_CAPSULE | Freq: Four times a day (QID) | ORAL | 0 refills | Status: AC

## 2019-12-28 NOTE — ED Provider Notes (Signed)
[facility]-EMERGENCY DEPT Provider Note   CSN: [id number] Arrival date & time: 12/28/19  1904     History Chief Complaint  Patient presents with  . Fever    Peter Ortiz is a 54 y.o. male with PMH of HTN, HLD, and leukopenia who presents to the ED with a 5-day history of fever, diarrhea, dry cough, and generalized malaise.  Patient reports that beginning 12/23/2019, he developed 8-10 loose nonbloody stools per day.  He went to an urgent care on 12/25/2019 and was informed he was dehydrated and prescribed him Imodium.  He has been taking Imodium, as directed.  He feels as though his loose stools have improved, however does not persist.  He states that they smell "awful" and "clear the room".  Patient's cough is nonproductive and he denies any hemoptysis.  No history of clots or clotting disorder.  Patient is accompanied by his wife at bedside.  He does state that he had been prescribed antibiotics at the end of July/early August which he took for 10 days for a sinus infection.  He has been attempting to replenish his fluids lost through diarrhea with Pedialyte and other fluids rich in electrolytes.  Prior abdominal surgery of appendectomy 40 years ago.  He is fully immunized against COVID-19.  HPI     Past Medical History:  Diagnosis Date  . Hypertension   . Tendinitis 1990    Patient Active Problem List   Diagnosis Date Noted  . Chronic rhinitis 05/04/2019  . Cough, persistent 05/04/2019  . Epistaxis 05/04/2019    History reviewed. No pertinent surgical history.     Family History  Problem Relation Age of Onset  . Asthma Maternal Uncle   . Allergic rhinitis Neg Hx   . Angioedema Neg Hx   . Atopy Neg Hx   . Eczema Neg Hx   . Immunodeficiency Neg Hx   . Urticaria Neg Hx     Social History   Tobacco Use  . Smoking status: Never Smoker  . Smokeless tobacco: Never Used  Vaping Use  . Vaping Use: Never used  Substance Use Topics  . Alcohol use:  Not on file  . Drug use: Not on file    Home Medications Prior to Admission medications   Medication Sig Start Date End Date Taking? Authorizing Provider  atorvastatin (LIPITOR) 80 MG tablet Take 80 mg by mouth daily.  01/01/14  Yes [provider]  Eszopiclone 3 MG TABS Take 3 mg by mouth at bedtime as needed (sleep).  12/02/19  Yes [provider]  Multiple Vitamin (MULTIVITAMIN WITH MINERALS) TABS tablet Take 1 tablet by mouth daily.   Yes [provider]  olmesartan (BENICAR) 20 MG tablet Take 20 mg by mouth daily. 09/23/19  Yes [provider]  omega-3 acid ethyl esters (LOVAZA) 1 G capsule Take 1 g by mouth daily.   Yes [provider]  rizatriptan (MAXALT) 10 MG tablet Take 10 mg by mouth as needed for migraine.  12/19/19  Yes [provider]  traZODone (DESYREL) 50 MG tablet Take 50 mg by mouth at bedtime as needed. for sleep 11/18/19  Yes [provider]  azelastine (ASTELIN) 0.1 % nasal spray 1 spray per nostril 1-2 times a day as needed Patient not taking: Reported on 12/28/2019 05/04/19   Bobbitt, Heywood Iles, MD  vancomycin (VANCOCIN HCL) 125 MG capsule Take 1 capsule (125 mg total) by mouth 4 (four) times daily for 10 days. 12/28/19 01/07/20  Lorelee New, PA-C    Allergies    Sulfamethoxazole  Review of Systems   Review of Systems  All other systems reviewed and are negative.   Physical Exam Updated Vital Signs BP (!) 151/79   Pulse 95   Temp (!) 102.9 F (39.4 C) (Oral)   Resp (!) 22   SpO2 100%   Physical Exam Vitals and nursing note reviewed. Exam conducted with a chaperone present.  HENT:     Head: Normocephalic and atraumatic.  Eyes:     General: No scleral icterus.    Conjunctiva/sclera: Conjunctivae normal.  Cardiovascular:     Rate and Rhythm: Normal rate and regular rhythm.     Pulses: Normal pulses.     Heart sounds: Normal heart sounds.  Pulmonary:     Effort: Pulmonary effort is normal.  No respiratory distress.     Breath sounds: Normal breath sounds. No wheezing or rales.  Abdominal:     Comments: Soft, nondistended.  No areas of tenderness.  No guarding.  No overlying skin changes.  Hyperactive bowel sounds.  Musculoskeletal:        General: Normal range of motion.  Skin:    General: Skin is dry.     Capillary Refill: Capillary refill takes less than 2 seconds.  Neurological:     General: No focal deficit present.     Mental Status: He is alert and oriented to person, place, and time.     GCS: GCS eye subscore is 4. GCS verbal subscore is 5. GCS motor subscore is 6.  Psychiatric:        Mood and Affect: Mood normal.        Behavior: Behavior normal.        Thought Content: Thought content normal.     ED Results / Procedures / Treatments   Labs (all labs ordered are listed, but only abnormal results are displayed) Labs Reviewed  C DIFFICILE QUICK SCREEN W PCR REFLEX - Abnormal; Notable for the following components:      Result Value   C Diff antigen POSITIVE (*)    C Diff toxin POSITIVE (*)    All other components within normal limits  CBC WITH DIFFERENTIAL/PLATELET - Abnormal; Notable for the following components:   Monocytes Absolute 1.5 (*)    All other components within normal limits  COMPREHENSIVE METABOLIC PANEL - Abnormal; Notable for the following components:   Calcium 8.6 (*)    All other components within normal limits  SARS CORONAVIRUS 2 BY RT PCR (HOSPITAL ORDER, PERFORMED IN [facility])  GASTROINTESTINAL PANEL BY PCR, STOOL (REPLACES STOOL CULTURE)  MAGNESIUM    EKG None  Radiology DG Chest 2 View  Result Date: 12/28/2019 CLINICAL DATA:  Cough. EXAM: CHEST - 2 VIEW COMPARISON:  12/21/2019 FINDINGS: The cardiac silhouette, mediastinal and hilar contours are within normal limits. Streaky subsegmental basilar atelectasis but no infiltrates, edema or effusions. No pulmonary lesions. The bony thorax is intact. IMPRESSION: Streaky  bibasilar atelectasis but no infiltrates or effusions. Electronically Signed   By: Rudie Meyer M.D.   On: 12/28/2019 20:24    Procedures Procedures (including critical care time)  Medications Ordered in ED Medications  vancomycin (VANCOCIN) 50 mg/mL oral solution 125 mg (has no administration in time range)  acetaminophen (TYLENOL) tablet 650 mg (650 mg Oral Given 12/28/19 1951)  sodium chloride 0.9 % bolus 1,000 mL (1,000 mLs Intravenous New Bag/Given 12/28/19 2052)    ED Course  I have  reviewed the triage vital signs and the nursing notes.  Pertinent labs & imaging results that were available during my care of the patient were reviewed by me and considered in my medical decision making (see chart for details).  Clinical Course as of Dec 27 2316  Wed Dec 28, 2019  2316 C Diff antigen(!): POSITIVE [GG]  2316 C Diff toxin(!): POSITIVE [GG]    Clinical Course User Index [GG] Lorelee New, PA-C   MDM Rules/Calculators/A&P                          Patient's history of loose nonbloody stools, generalized malaise, and nonproductive cough is suggestive of viral infection.  Emphasized importance of fluid resuscitation.  Will provide patient with 1 L IV NS here in the ED while laboratory work-up is pending.  Patient given Tylenol for his document fever of 102.9 F.  Patient is in no acute distress on my examination.  Given 5-day chronicity of loose stools in conjunction with his recent antibiotic use and reports of "foul smell", will obtain GI panel by PCR and C. difficile quick screen.  COVID-19 testing is pending.  Plain films of chest were personally reviewed and demonstrate no acute cardiopulmonary findings.  Labs CBC with differential: Entirely WNL. CMP: Unremarkable. COVID-19 testing by PCR: Negative. C. difficile quick screen with PCR reflex: Positive. GI panel by PCR: In process.  Patient's history, physical exam, and laboratory work-up is suggestive of C. difficile  infection.  This is likely secondary to antibiotics prescribed for a sinus infection at the end of July.  I encouraged her to continue taking Imodium as needed for diarrhea control.  I also upsized importance of increased oral hydration to avoid dehydration.  Patient will continue checking his temperature regularly and take Tylenol or ibuprofen as needed for fever control.  All of the evaluation and work-up results were discussed with the patient and any family at bedside.  Patient and/or family were informed that while patient is appropriate for discharge at this time, some medical emergencies may only develop or become detectable after a period of time.  I specifically instructed patient and/or family to return to return to the ED or seek immediate medical attention for any new or worsening symptoms.  They were provided opportunity to ask any additional questions and have none at this time.  Prior to discharge patient is feeling well, agreeable with plan for discharge home.  They have expressed understanding of verbal discharge instructions as well as return precautions and are agreeable to the plan.    Final Clinical Impression(s) / ED Diagnoses Final diagnoses:  Clostridium difficile diarrhea    Rx / DC Orders ED Discharge Orders         Ordered    vancomycin (VANCOCIN HCL) 125 MG capsule  4 times daily        12/28/19 2247           Lorelee New, PA-C 12/28/19 2318    Pollyann Savoy, MD 12/28/19 2324

## 2019-12-28 NOTE — ED Triage Notes (Addendum)
Patient arrived stating that he had diarrhea over the weekend and went to his PCP, states he had a negative covid test and diagnosed with dehydration. States now over the last few days he has continued to feel bad but developed a dry cough. Patient vaccinated against Covid-19

## 2019-12-28 NOTE — Discharge Instructions (Signed)
Please read the attachment of Clostridium difficile infection.  I advise that you use separate toilets.  Please take the vancomycin, as directed.   I will like for you to follow-up with your primary care provider regarding today's encounter.  Please call ahead given enteric precautions.  It is important that you drink plenty of fluids to avoid dehydration.  Continue to check your temperature regularly and take Tylenol as needed for fever control.  Return to the ED or seek immediate medical attention should you experience any new or worsening symptoms.

## 2019-12-28 NOTE — ED Notes (Signed)
Pt ambulatory to and from the bathroom w/o assistance and with steady gait Pt able to provide stool sample Sample sent lab to per order

## 2019-12-29 LAB — GASTROINTESTINAL PANEL BY PCR, STOOL (REPLACES STOOL CULTURE)

## 2020-01-02 DIAGNOSIS — G4700 Insomnia, unspecified: Secondary | ICD-10-CM | POA: Diagnosis not present

## 2020-01-02 DIAGNOSIS — J3489 Other specified disorders of nose and nasal sinuses: Secondary | ICD-10-CM | POA: Diagnosis not present

## 2020-01-02 DIAGNOSIS — A0472 Enterocolitis due to Clostridium difficile, not specified as recurrent: Secondary | ICD-10-CM | POA: Diagnosis not present

## 2020-01-02 DIAGNOSIS — M533 Sacrococcygeal disorders, not elsewhere classified: Secondary | ICD-10-CM | POA: Diagnosis not present

## 2020-01-13 DIAGNOSIS — Z8719 Personal history of other diseases of the digestive system: Secondary | ICD-10-CM | POA: Diagnosis not present

## 2020-01-13 DIAGNOSIS — R5383 Other fatigue: Secondary | ICD-10-CM | POA: Diagnosis not present

## 2020-01-13 DIAGNOSIS — R197 Diarrhea, unspecified: Secondary | ICD-10-CM | POA: Diagnosis not present

## 2020-01-13 DIAGNOSIS — R05 Cough: Secondary | ICD-10-CM | POA: Diagnosis not present

## 2020-01-13 DIAGNOSIS — K219 Gastro-esophageal reflux disease without esophagitis: Secondary | ICD-10-CM | POA: Diagnosis not present

## 2020-01-14 DIAGNOSIS — M25512 Pain in left shoulder: Secondary | ICD-10-CM | POA: Diagnosis not present

## 2020-01-25 DIAGNOSIS — G4733 Obstructive sleep apnea (adult) (pediatric): Secondary | ICD-10-CM | POA: Diagnosis not present

## 2020-01-25 DIAGNOSIS — G4700 Insomnia, unspecified: Secondary | ICD-10-CM | POA: Diagnosis not present

## 2020-02-03 DIAGNOSIS — I10 Essential (primary) hypertension: Secondary | ICD-10-CM | POA: Diagnosis not present

## 2020-02-03 DIAGNOSIS — Z23 Encounter for immunization: Secondary | ICD-10-CM | POA: Diagnosis not present

## 2020-02-03 DIAGNOSIS — G4733 Obstructive sleep apnea (adult) (pediatric): Secondary | ICD-10-CM | POA: Diagnosis not present

## 2020-02-03 DIAGNOSIS — A0472 Enterocolitis due to Clostridium difficile, not specified as recurrent: Secondary | ICD-10-CM | POA: Diagnosis not present

## 2020-02-03 DIAGNOSIS — G4700 Insomnia, unspecified: Secondary | ICD-10-CM | POA: Diagnosis not present

## 2020-02-20 DIAGNOSIS — M545 Low back pain, unspecified: Secondary | ICD-10-CM | POA: Diagnosis not present

## 2020-03-13 ENCOUNTER — Other Ambulatory Visit: Payer: Self-pay | Admitting: Orthopedic Surgery

## 2020-03-13 DIAGNOSIS — M545 Low back pain, unspecified: Secondary | ICD-10-CM

## 2020-04-28 ENCOUNTER — Other Ambulatory Visit: Payer: Federal, State, Local not specified - PPO

## 2020-04-28 DIAGNOSIS — Z20822 Contact with and (suspected) exposure to covid-19: Secondary | ICD-10-CM

## 2020-05-01 DIAGNOSIS — Z20828 Contact with and (suspected) exposure to other viral communicable diseases: Secondary | ICD-10-CM | POA: Diagnosis not present

## 2020-05-01 LAB — NOVEL CORONAVIRUS, NAA: SARS-CoV-2, NAA: DETECTED — AB

## 2020-06-29 ENCOUNTER — Other Ambulatory Visit: Payer: Self-pay

## 2020-06-29 ENCOUNTER — Encounter: Payer: Self-pay | Admitting: Family

## 2020-06-29 ENCOUNTER — Ambulatory Visit: Payer: Federal, State, Local not specified - PPO | Admitting: Family

## 2020-06-29 VITALS — BP 136/60 | HR 78 | Temp 98.4°F | Resp 16 | Ht 68.0 in | Wt 188.1 lb

## 2020-06-29 DIAGNOSIS — J310 Chronic rhinitis: Secondary | ICD-10-CM | POA: Diagnosis not present

## 2020-06-29 MED ORDER — AZELASTINE HCL 0.1 % NA SOLN: NASAL | 1 refills | Status: AC

## 2020-06-29 MED ORDER — TRIAMCINOLONE ACETONIDE 55 MCG/ACT NA AERO: INHALATION_SPRAY | NASAL | 1 refills | Status: AC

## 2020-06-29 NOTE — Progress Notes (Signed)
[address] [place] [address] Dept: [phone]  FOLLOW UP NOTE  Patient ID: Peter Ortiz, male    DOB: [birth year]  Age: 55 y.o. MRN: [id number] Date of Office Visit: 06/29/2020  Assessment  Chief Complaint: Nasal Congestion  HPI Peter Ortiz a 55 year old male who presents today for an acute visit.  He was last seen on October 17, 2019 by Dr. Selena Batten for chronic rhinitis and persistent cough.  Chronic rhinitis is reported as not well controlled with azelastine nasal spray.  He recently restarted this medication approximately 2 weeks ago and it has helped some.  He reports bilateral pressure in his forehead, bilateral ear pain at times, clear rhinorrhea, occasional nasal congestion, and postnasal drip.  He denies any fever or chills.  His last sinus infection was in July 2021.  On October 19, 2019 he had a CT head without contrast showing, "Unremarkable noncontrast CT appearance of the brain. No evidence of acute intracranial abnormality.Mild ethmoid sinus mucosal thickening. Small left sphenoid sinusmucous retention cyst."  He also had an appointment with ENT Dr. Christell Constant on December 14, 2019 and was told that he had "normal paranasal sinuses except for small retention cyst in the splenoid sinuses and he felt that the symptoms he was having were due to to more of a migraine type process."  He gave him a prescription for Maxalt.  He did not ever try Maxalt because he did not feel like it was symptoms of a migraine.  At his last office visit Dr. Selena Batten gave him a sample of [facility] and he does not remember if this helped with his symptoms.  In the past he has tried carbinoxamine and this caused dizziness.   He no longer has a cough.   Drug Allergies:  Allergies  Allergen Reactions  . Amoxicillin-Pot Clavulanate Other (See Comments)  . Ofloxacin Other (See Comments)  . Other Rash and Other (See Comments)  . Sulfamethoxazole Rash    Review of Systems: Review of Systems  Constitutional:  Negative for chills and fever.  HENT:       Reports occasional nasal congestion, clear rhinorrhea and occasional postnasal drip  Eyes:       Reports watery eyes.  Denies itchy eyes  Respiratory: Negative for cough, shortness of breath and wheezing.   Cardiovascular: Negative for chest pain and palpitations.  Gastrointestinal: Negative for abdominal pain and heartburn.  Genitourinary: Negative for dysuria.  Skin: Negative for itching and rash.  Neurological: Negative for headaches.  Endo/Heme/Allergies: Negative for environmental allergies.    Physical Exam: BP 136/60 (BP Location: Right Arm, Patient Position: Sitting, Cuff Size: Normal)   Pulse 78   Temp 98.4 F (36.9 C) (Temporal)   Resp 16   Ht 5\' 8"  (1.727 m)   Wt 188 lb 0.8 oz (85.3 kg)   SpO2 98%   BMI 28.59 kg/m    Physical Exam Constitutional:      Appearance: Normal appearance.  HENT:     Head: Normocephalic and atraumatic.     Comments: Pharynx normal, eyes normal, ears normal, nose bilateral lower turbinates mildly edematous and slightly erythematous with no drainage noted    Right Ear: Tympanic membrane, ear canal and external ear normal.     Left Ear: Tympanic membrane, ear canal and external ear normal.     Mouth/Throat:     Mouth: Mucous membranes are moist.     Pharynx: Oropharynx is clear.  Eyes:     Conjunctiva/sclera: Conjunctivae normal.  Cardiovascular:     Rate and Rhythm: Regular rhythm.     Heart sounds: Normal heart sounds.  Pulmonary:     Effort: Pulmonary effort is normal.     Breath sounds: Normal breath sounds.     Comments: Lungs clear to auscultation Musculoskeletal:     Cervical back: Neck supple.  Skin:    General: Skin is warm.  Neurological:     Mental Status: He is alert and oriented to person, place, and time.  Psychiatric:        Mood and Affect: Mood normal.        Behavior: Behavior normal.        Thought Content: Thought content normal.        Judgment: Judgment normal.      Diagnostics: None  Assessment and Plan: 1. Chronic rhinitis     No orders of the defined types were placed in this encounter.   Patient Instructions  Chronic rhinitis (negative skin test-2021) Start Nasacort 1-2 sprays each nostril once a day as needed for stuffy nose Continue azelastine 1-2 sprays each nostril twice a day as needed for runny nose/drainage down throat May use saline nasal rinse as needed. Use this prior to any medicated nasal sprays  Cough, persistent Resolved  Please let us known if this treatment plan is not working well for you. Schedule a follow up appointment in 3 months   Return in about 3 months (around 09/29/2020), or if symptoms worsen or fail to improve.    Thank you for the opportunity to care for this patient.  Please do not hesitate to contact me with questions.  Nehemiah Settle, FNP [facility]

## 2020-06-29 NOTE — Patient Instructions (Addendum)
Chronic rhinitis (negative skin test-2021) Start Nasacort 1-2 sprays each nostril once a day as needed for stuffy nose Continue azelastine 1-2 sprays each nostril twice a day as needed for runny nose/drainage down throat May use saline nasal rinse as needed. Use this prior to any medicated nasal sprays  Cough, persistent Resolved  Please let us known if this treatment plan is not working well for you. Schedule a follow up appointment in 3 months

## 2020-09-11 DIAGNOSIS — E559 Vitamin D deficiency, unspecified: Secondary | ICD-10-CM | POA: Diagnosis not present

## 2020-09-11 DIAGNOSIS — Z0000 Encounter for general adult medical examination without abnormal findings: Secondary | ICD-10-CM | POA: Diagnosis not present

## 2020-09-11 DIAGNOSIS — I10 Essential (primary) hypertension: Secondary | ICD-10-CM | POA: Diagnosis not present

## 2020-09-11 DIAGNOSIS — E7800 Pure hypercholesterolemia, unspecified: Secondary | ICD-10-CM | POA: Diagnosis not present

## 2020-09-11 DIAGNOSIS — Z125 Encounter for screening for malignant neoplasm of prostate: Secondary | ICD-10-CM | POA: Diagnosis not present

## 2020-09-11 DIAGNOSIS — N529 Male erectile dysfunction, unspecified: Secondary | ICD-10-CM | POA: Diagnosis not present

## 2020-09-24 DIAGNOSIS — M533 Sacrococcygeal disorders, not elsewhere classified: Secondary | ICD-10-CM | POA: Diagnosis not present

## 2020-09-27 ENCOUNTER — Other Ambulatory Visit: Payer: Self-pay | Admitting: Physical Medicine and Rehabilitation

## 2020-09-27 DIAGNOSIS — M533 Sacrococcygeal disorders, not elsewhere classified: Secondary | ICD-10-CM

## 2020-10-06 ENCOUNTER — Ambulatory Visit
Admission: RE | Admit: 2020-10-06 | Discharge: 2020-10-06 | Disposition: A | Discharge status: Home / Self Care | Payer: Federal, State, Local not specified - PPO | Source: Ambulatory Visit | Attending: Physical Medicine and Rehabilitation | Admitting: Physical Medicine and Rehabilitation

## 2020-10-06 DIAGNOSIS — M461 Sacroiliitis, not elsewhere classified: Secondary | ICD-10-CM | POA: Diagnosis not present

## 2020-10-06 DIAGNOSIS — M533 Sacrococcygeal disorders, not elsewhere classified: Secondary | ICD-10-CM

## 2020-10-06 DIAGNOSIS — I700 Atherosclerosis of aorta: Secondary | ICD-10-CM | POA: Diagnosis not present

## 2020-10-09 DIAGNOSIS — M533 Sacrococcygeal disorders, not elsewhere classified: Secondary | ICD-10-CM | POA: Diagnosis not present

## 2020-10-16 ENCOUNTER — Ambulatory Visit: Payer: Federal, State, Local not specified - PPO | Admitting: Family

## 2020-10-30 DIAGNOSIS — M533 Sacrococcygeal disorders, not elsewhere classified: Secondary | ICD-10-CM | POA: Diagnosis not present

## 2020-11-12 DIAGNOSIS — M25561 Pain in right knee: Secondary | ICD-10-CM | POA: Diagnosis not present

## 2020-12-09 DIAGNOSIS — T63441A Toxic effect of venom of bees, accidental (unintentional), initial encounter: Secondary | ICD-10-CM | POA: Diagnosis not present

## 2021-02-21 DIAGNOSIS — Z23 Encounter for immunization: Secondary | ICD-10-CM | POA: Diagnosis not present

## 2021-07-12 DIAGNOSIS — H524 Presbyopia: Secondary | ICD-10-CM | POA: Diagnosis not present

## 2021-07-12 DIAGNOSIS — H2513 Age-related nuclear cataract, bilateral: Secondary | ICD-10-CM | POA: Diagnosis not present

## 2021-11-03 IMAGING — CR DG SACRUM/COCCYX 2+V
3 series · 3 of 3 positions shown · non-contrast
Comparison: June 21, 2019

CLINICAL DATA: Coccyx pain

EXAM:
SACRUM AND COCCYX - 2+ VIEW

[t sacrum ap]
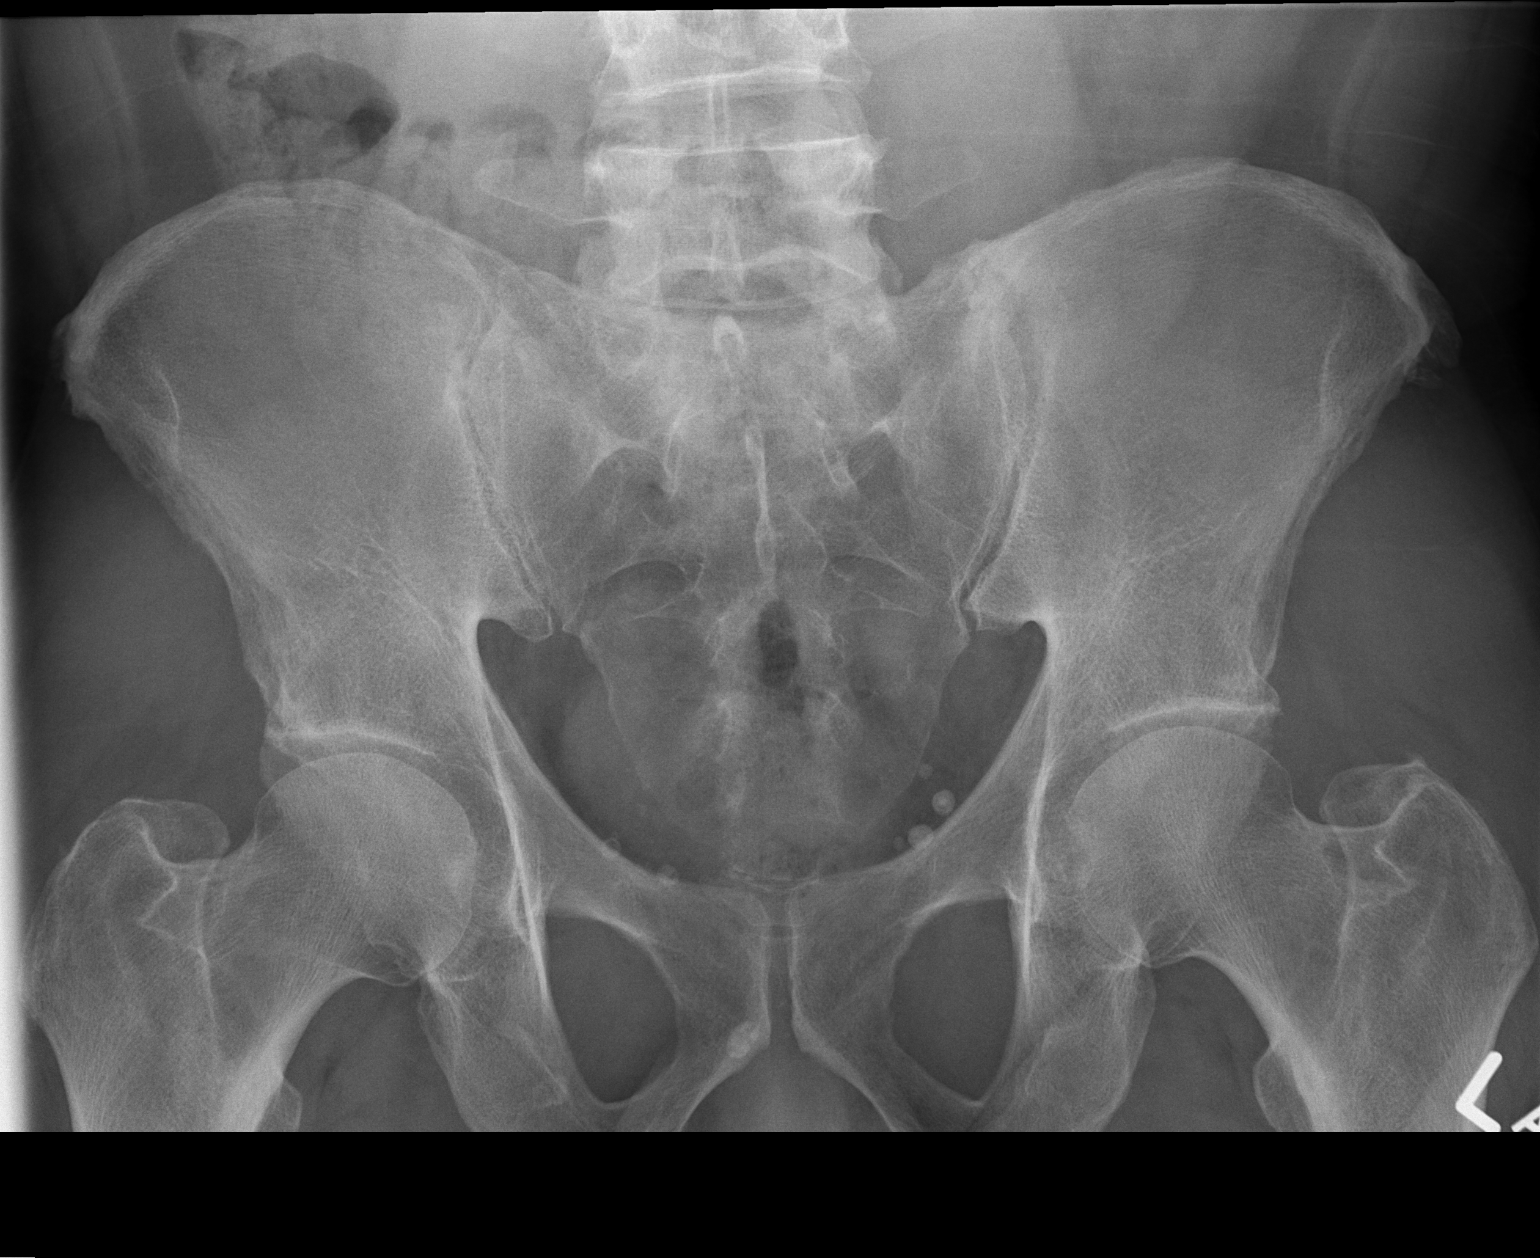

[t coccyx ap]
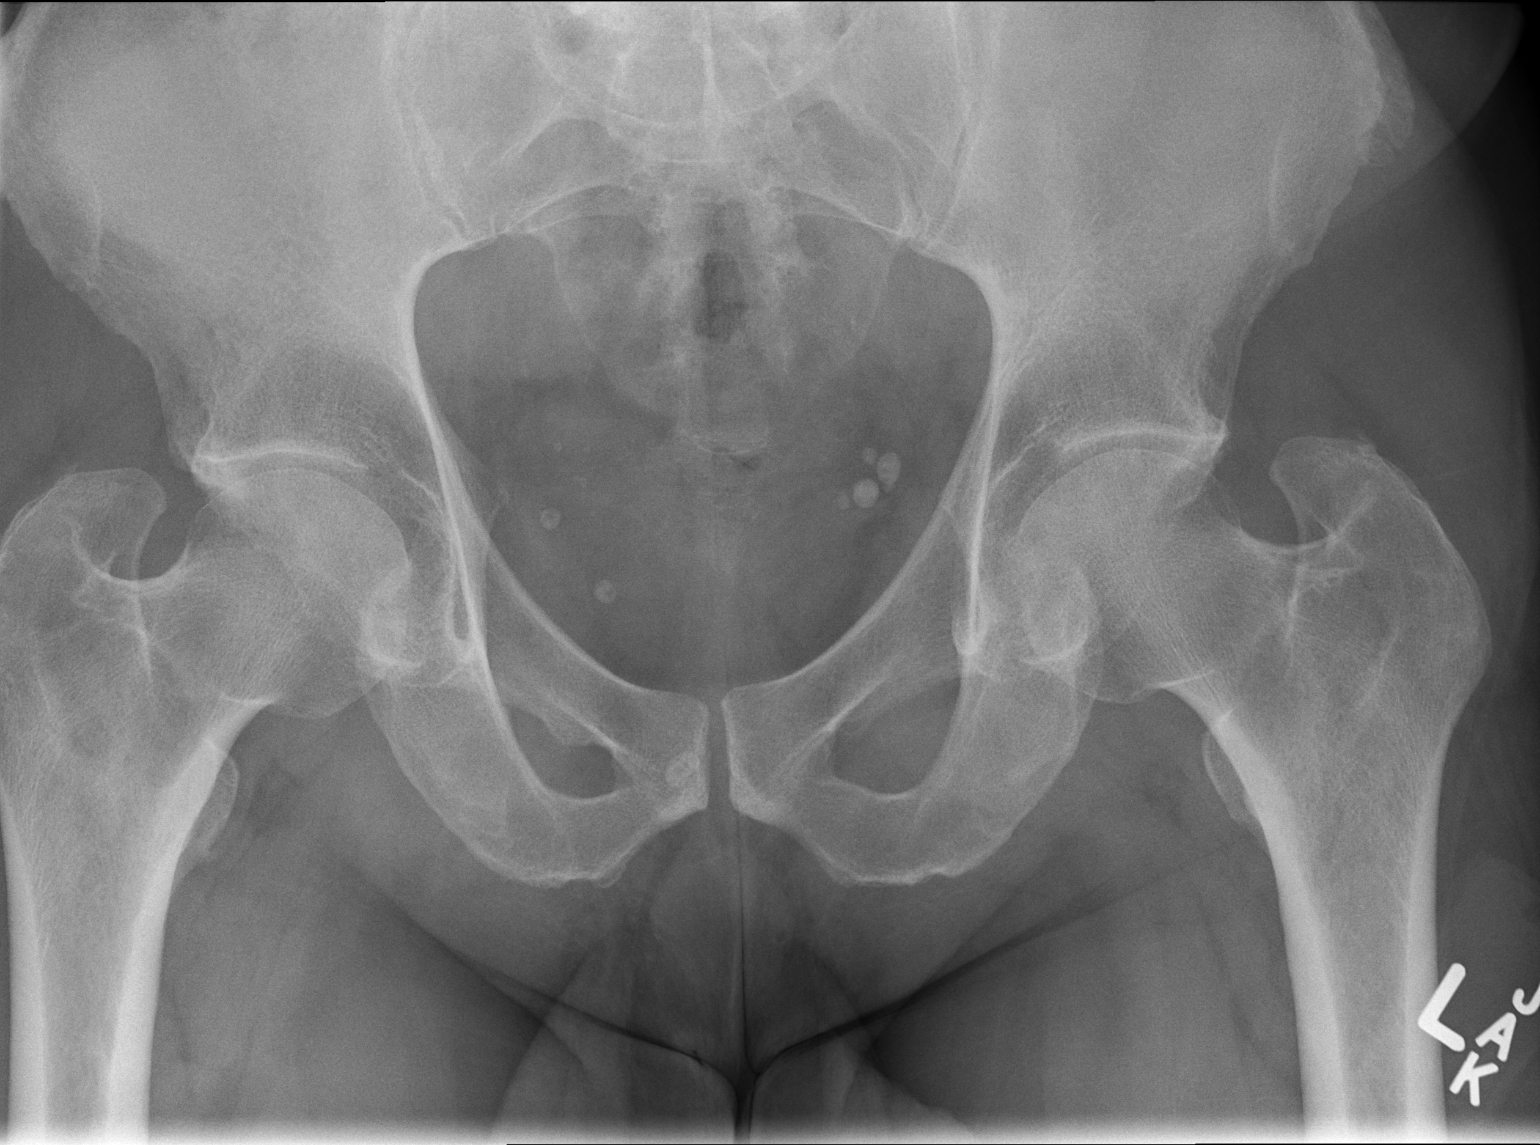

[t sacrum coccyx lat]
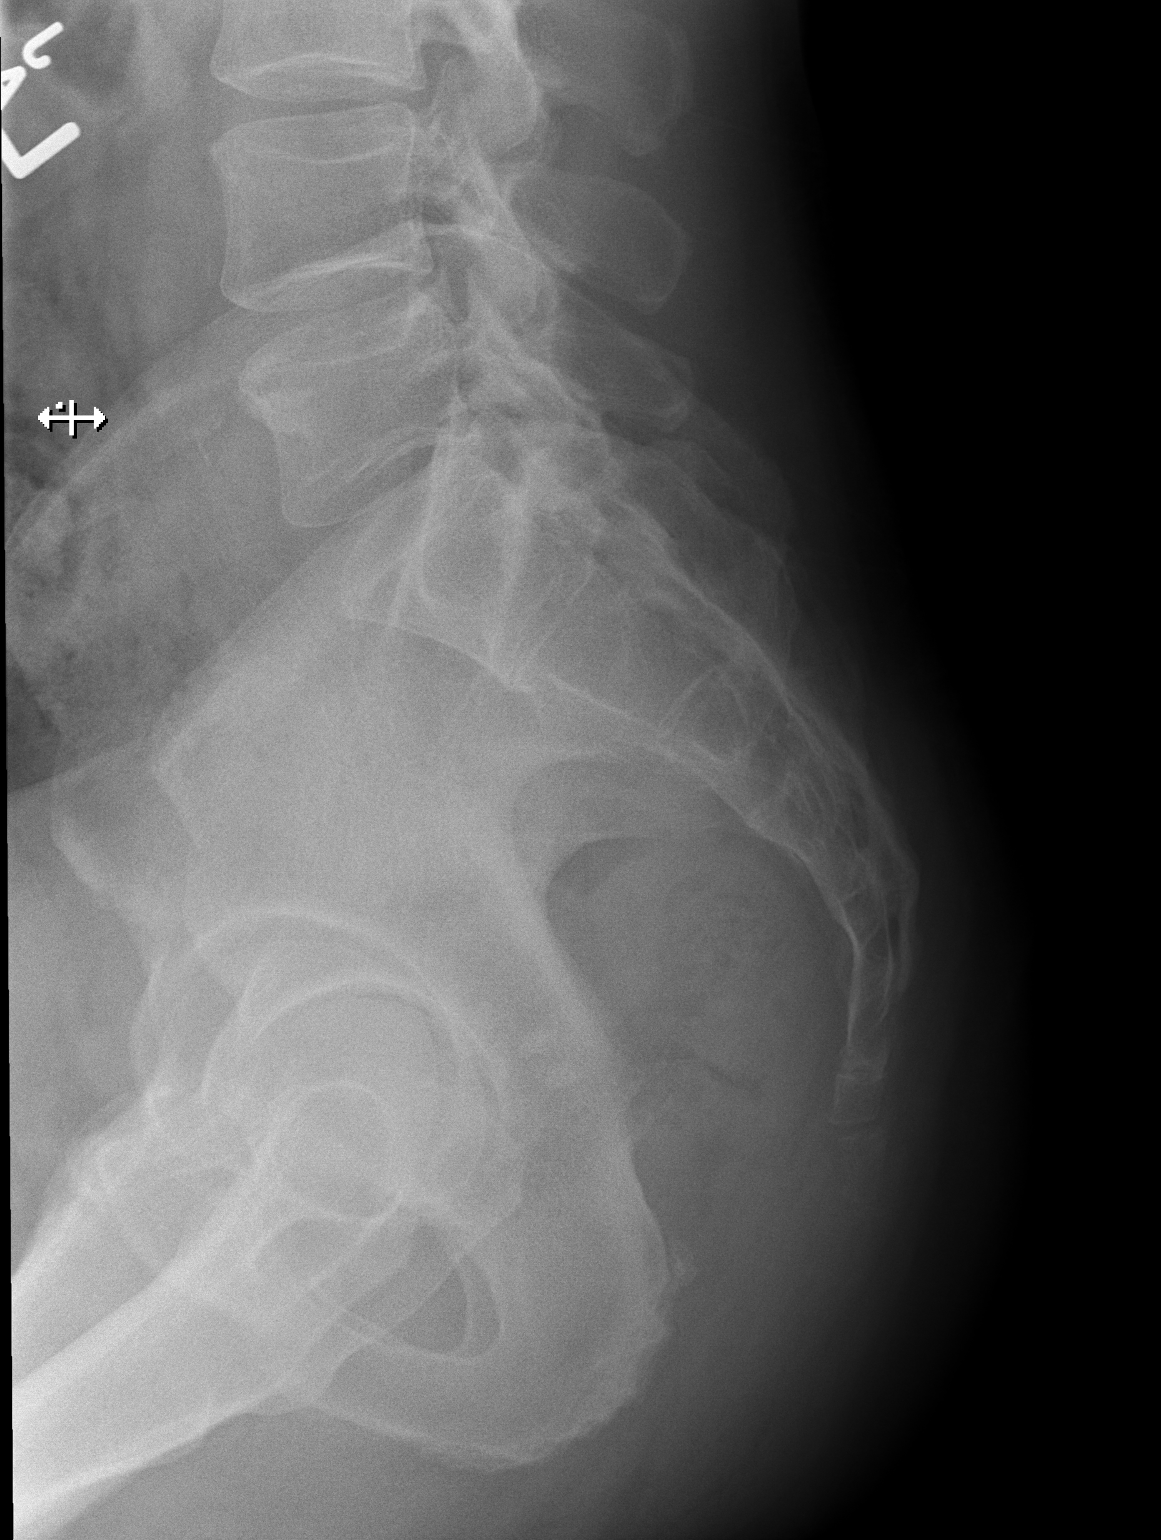

[3 of 3 positions shown; findings below may reference images not displayed]

FINDINGS: No acute fracture or dislocation. Mild degenerative changes of the
pubic symphysis and lower lumbar spine. No area of erosion or
osseous destruction. No unexpected radiopaque foreign body. Pelvic
phleboliths. Soft tissues are unremarkable.
IMPRESSION: No acute fracture or dislocation.

## 2021-11-13 DIAGNOSIS — R7303 Prediabetes: Secondary | ICD-10-CM | POA: Diagnosis not present

## 2021-11-13 DIAGNOSIS — I10 Essential (primary) hypertension: Secondary | ICD-10-CM | POA: Diagnosis not present

## 2021-11-13 DIAGNOSIS — Z23 Encounter for immunization: Secondary | ICD-10-CM | POA: Diagnosis not present

## 2021-11-13 DIAGNOSIS — A6002 Herpesviral infection of other male genital organs: Secondary | ICD-10-CM | POA: Diagnosis not present

## 2021-11-13 DIAGNOSIS — E785 Hyperlipidemia, unspecified: Secondary | ICD-10-CM | POA: Diagnosis not present

## 2021-11-18 DIAGNOSIS — R0781 Pleurodynia: Secondary | ICD-10-CM | POA: Diagnosis not present

## 2021-11-18 DIAGNOSIS — R1011 Right upper quadrant pain: Secondary | ICD-10-CM | POA: Diagnosis not present

## 2021-11-22 ENCOUNTER — Emergency Department (HOSPITAL_COMMUNITY): Payer: Federal, State, Local not specified - PPO

## 2021-11-22 ENCOUNTER — Emergency Department (HOSPITAL_COMMUNITY)
Admission: EM | Admit: 2021-11-22 | Discharge: 2021-11-22 | Disposition: A | Discharge status: Home / Self Care | Payer: Federal, State, Local not specified - PPO | Attending: Emergency Medicine | Admitting: Emergency Medicine

## 2021-11-22 ENCOUNTER — Other Ambulatory Visit: Payer: Self-pay

## 2021-11-22 ENCOUNTER — Encounter (HOSPITAL_COMMUNITY): Payer: Self-pay

## 2021-11-22 DIAGNOSIS — I700 Atherosclerosis of aorta: Secondary | ICD-10-CM | POA: Diagnosis not present

## 2021-11-22 DIAGNOSIS — S39011A Strain of muscle, fascia and tendon of abdomen, initial encounter: Secondary | ICD-10-CM | POA: Diagnosis not present

## 2021-11-22 DIAGNOSIS — R1011 Right upper quadrant pain: Secondary | ICD-10-CM | POA: Diagnosis not present

## 2021-11-22 DIAGNOSIS — S3991XA Unspecified injury of abdomen, initial encounter: Secondary | ICD-10-CM | POA: Diagnosis not present

## 2021-11-22 DIAGNOSIS — R109 Unspecified abdominal pain: Secondary | ICD-10-CM | POA: Diagnosis not present

## 2021-11-22 DIAGNOSIS — X500XXA Overexertion from strenuous movement or load, initial encounter: Secondary | ICD-10-CM | POA: Diagnosis not present

## 2021-11-22 DIAGNOSIS — T148XXA Other injury of unspecified body region, initial encounter: Secondary | ICD-10-CM

## 2021-11-22 LAB — CBC
HCT: 41.1 % (ref 39.0–52.0)
Hemoglobin: 14.2 g/dL (ref 13.0–17.0)
MCH: 30 pg (ref 26.0–34.0)
MCHC: 34.5 g/dL (ref 30.0–36.0)
MCV: 86.9 fL (ref 80.0–100.0)
Platelets: 246 10*3/uL (ref 150–400)
RBC: 4.73 MIL/uL (ref 4.22–5.81)
RDW: 12.3 % (ref 11.5–15.5)
WBC: 9.4 10*3/uL (ref 4.0–10.5)
nRBC: 0 % (ref 0.0–0.2)

## 2021-11-22 LAB — COMPREHENSIVE METABOLIC PANEL
ALT: 37 U/L (ref 0–44)
AST: 34 U/L (ref 15–41)
Albumin: 4.2 g/dL (ref 3.5–5.0)
Alkaline Phosphatase: 76 U/L (ref 38–126)
Anion gap: 6 (ref 5–15)
BUN: 21 mg/dL — ABNORMAL HIGH (ref 6–20)
CO2: 23 mmol/L (ref 22–32)
Calcium: 9.3 mg/dL (ref 8.9–10.3)
Chloride: 107 mmol/L (ref 98–111)
Creatinine, Ser: 0.88 mg/dL (ref 0.61–1.24)
GFR, Estimated: >60 mL/min (ref >60)
Glucose, Bld: 101 mg/dL — ABNORMAL HIGH (ref 70–99)
Potassium: 3.7 mmol/L (ref 3.5–5.1)
Sodium: 136 mmol/L (ref 135–145)
Total Bilirubin: 0.8 mg/dL (ref 0.3–1.2)
Total Protein: 8.2 g/dL — ABNORMAL HIGH (ref 6.5–8.1)

## 2021-11-22 LAB — URINALYSIS, ROUTINE W REFLEX MICROSCOPIC
Bilirubin Urine: NEGATIVE
Glucose, UA: NEGATIVE mg/dL
Hgb urine dipstick: NEGATIVE
Ketones, ur: NEGATIVE mg/dL
Leukocytes,Ua: NEGATIVE
Nitrite: NEGATIVE
Protein, ur: NEGATIVE mg/dL
Specific Gravity, Urine: 1.009 (ref 1.005–1.030)
pH: 6 (ref 5.0–8.0)

## 2021-11-22 LAB — LIPASE, BLOOD: Lipase: 26 U/L (ref 11–51)

## 2021-11-22 MED ORDER — IOHEXOL 300 MG/ML  SOLN
100.0000 mL | Freq: Once | INTRAMUSCULAR | Status: AC | PRN
  Administered 2021-11-22: 100 mL via INTRAVENOUS

## 2021-11-22 MED ORDER — LIDOCAINE 5 % EX PTCH
1.0000 | MEDICATED_PATCH | CUTANEOUS | Status: DC
  Administered 2021-11-22: 1 via TRANSDERMAL
  Filled 2021-11-22: qty 1

## 2021-11-22 NOTE — ED Provider Notes (Signed)
[facility]-EMERGENCY DEPT Provider Note   CSN: [id number] Arrival date & time: 11/22/21  1410     History  Chief Complaint  Patient presents with   Abdominal Pain    Peter Ortiz is a 56 y.o. male.  Patient with history of hypertension and appendectomy presents today with complaints of right upper quadrant abdominal pain. States that same has been persistent over the past 2 weeks and unchanged. States that he saw his PCP for same who suspected it was MSK in nature and gave NSAIDs and muscle relaxer for same which he has been taking with minimal relief. States that same is worse with movements and relieved when sitting still. States that ice and heat have given him the most relief. 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 Denies any nausea, vomiting, or diarrhea. No chest pain or shortness of breath.   The history is provided by the patient. No language interpreter was used.  Abdominal Pain      Home Medications Prior to Admission medications   Medication Sig Start Date End Date Taking? Authorizing Provider  atorvastatin (LIPITOR) 80 MG tablet Take 80 mg by mouth daily.  01/01/14  Yes [provider]  cholecalciferol (VITAMIN D3) 25 MCG (1000 UNIT) tablet Take 1,000 Units by mouth daily.   Yes [provider]  methocarbamol (ROBAXIN) 500 MG tablet Take 500 mg by mouth 3 (three) times daily. 11/18/21  Yes [provider]  Misc Natural Products (ELDERBERRY IMMUNE COMPLEX) CHEW Chew 1 tablet by mouth every other day.   Yes [provider]  Multiple Vitamin (MULTIVITAMIN WITH MINERALS) TABS tablet Take 1 tablet by mouth daily.   Yes [provider]  naproxen (NAPROSYN) 500 MG tablet Take 500 mg by mouth 2 (two) times daily. 11/18/21  Yes [provider]  omega-3 acid ethyl esters (LOVAZA) 1 G capsule Take 1 g  by mouth daily.   Yes [provider]  sildenafil (REVATIO) 20 MG tablet Take 20 mg by mouth daily as needed (ED).   Yes [provider]  valACYclovir (VALTREX) 1000 MG tablet Take 1,000 mg by mouth daily.   Yes [provider]  azelastine (ASTELIN) 0.1 % nasal spray 1 spray per nostril 1-2 times a day as needed Patient not taking: Reported on 11/22/2021 06/29/20   Nehemiah Settle, FNP  triamcinolone (NASACORT) 55 MCG/ACT AERO nasal inhaler Use 1-2 sprays each nostril once a day as needed for stuffy nose Patient not taking: Reported on 11/22/2021 06/29/20   Nehemiah Settle, FNP      Allergies    Amoxicillin-pot clavulanate, Ofloxacin, Other, and Sulfamethoxazole    Review of Systems   Review of Systems  Gastrointestinal:  Positive for abdominal pain.  All other systems reviewed and are negative.   Physical Exam Updated Vital Signs BP (!) 149/85   Pulse 86   Temp 98.6 F (37 C)   Resp 18   SpO2 100%  Physical Exam Vitals and nursing note reviewed.  Constitutional:      General: He is not in acute distress.    Appearance: Normal appearance. He is well-developed and normal weight. He is not ill-appearing, toxic-appearing or diaphoretic.     Comments: Well appearing healthy male sitting in bed in no acute distress  HENT:     Head: Normocephalic and atraumatic.  Cardiovascular:     Rate and Rhythm:  Normal rate and regular rhythm.  Pulmonary:     Effort: Pulmonary effort is normal. No respiratory distress.     Breath sounds: Normal breath sounds.  Abdominal:     Comments: Tenderness to palpation noted over the RUQ and into the right flank area. No overlying skin changes or deformity noted  Musculoskeletal:        General: Normal range of motion.     Cervical back: Normal range of motion.  Skin:    General: Skin is warm and dry.  Neurological:     General: No focal deficit present.     Mental Status: He is alert.  Psychiatric:        Mood and Affect:  Mood normal.        Behavior: Behavior normal.     ED Results / Procedures / Treatments   Labs (all labs ordered are listed, but only abnormal results are displayed) Labs Reviewed  COMPREHENSIVE METABOLIC PANEL - Abnormal; Notable for the following components:      Result Value   Glucose, Bld 101 (*)    BUN 21 (*)    Total Protein 8.2 (*)    All other components within normal limits  URINALYSIS, ROUTINE W REFLEX MICROSCOPIC - Abnormal; Notable for the following components:   Color, Urine STRAW (*)    All other components within normal limits  LIPASE, BLOOD  CBC    EKG None  Radiology CT ABDOMEN PELVIS W CONTRAST  Result Date: 11/22/2021 CLINICAL DATA:  Abdominal pain, acute, nonlocalized EXAM: CT ABDOMEN AND PELVIS WITH CONTRAST TECHNIQUE: Multidetector CT imaging of the abdomen and pelvis was performed using the standard protocol following bolus administration of intravenous contrast. RADIATION DOSE REDUCTION: This exam was performed according to the departmental dose-optimization program which includes automated exposure control, adjustment of the mA and/or kV according to patient size and/or use of iterative reconstruction technique. CONTRAST:  OMNIPAQUE IOHEXOL 300 MG/ML  SOLN COMPARISON:  06/21/2018 FINDINGS: Lower chest: Linear scarring or atelectasis in the lung bases. No effusions. Hepatobiliary: No focal hepatic abnormality. Gallbladder unremarkable. Pancreas: No focal abnormality or ductal dilatation. Spleen: No focal abnormality.  Normal size. Adrenals/Urinary Tract: No adrenal abnormality. No focal renal abnormality. No stones or hydronephrosis. Urinary bladder is unremarkable. Stomach/Bowel: Stomach, large and small bowel grossly unremarkable. Appendix not definitively seen. No pericecal inflammation. Vascular/Lymphatic: Scattered aortic atherosclerosis. No evidence of aneurysm or adenopathy. Reproductive: No visible focal abnormality. Other: No free fluid or free air.  Musculoskeletal: No acute bony abnormality. IMPRESSION: No acute findings in the abdomen or pelvis. Scattered aortic atherosclerosis. Bibasilar scarring or atelectasis. Electronically Signed   By: Charlett Nose M.D.   On: 11/22/2021 18:02   US Abdomen Limited RUQ (LIVER/GB)  Result Date: 11/22/2021 CLINICAL DATA:  Right upper quadrant abdominal pain EXAM: ULTRASOUND ABDOMEN LIMITED RIGHT UPPER QUADRANT COMPARISON:  None Available. FINDINGS: Gallbladder: No gallstones or wall thickening visualized. Common bile duct: Diameter: 2 mm.  No intrahepatic biliary ductal dilatation. Liver: No focal lesion identified. Within normal limits in parenchymal echogenicity. Portal vein is patent on color Doppler imaging with normal direction of blood flow towards the liver. Other: None. IMPRESSION: No acute process in the right upper quadrant. Electronically Signed   By: Wiliam Ke M.D.   On: 11/22/2021 16:10    Procedures Procedures    Medications Ordered in ED Medications  lidocaine (LIDODERM) 5 % 1 patch (1 patch Transdermal Patch Applied 11/22/21 1652)  iohexol (OMNIPAQUE) 300 MG/ML solution 100 mL (100  mLs Intravenous Contrast Given 11/22/21 1745)    ED Course/ Medical Decision Making/ A&P                           Medical Decision Making Amount and/or Complexity of Data Reviewed Labs: ordered. Radiology: ordered.  Risk Prescription drug management.   This patient presents to the ED for concern of right sided abdominal pain, this involves an extensive number of treatment options, and is a complaint that carries with it a high risk of complications and morbidity.  The differential diagnosis includes cholecystitis, MSK, pancreatitis. This is not an exhaustive differential   Co morbidities that complicate the patient evaluation  Hx hypertension   Lab Tests:  I Ordered, and personally interpreted labs.  The pertinent results include:  no acute laboratory findings   Imaging Studies ordered:  I  ordered imaging studies including RUQ ultrasound, CT abdomen pelvis  I independently visualized and interpreted imaging which showed no acute findings I agree with the radiologist interpretation   Problem List / ED Course / Critical interventions / Medication management  I ordered medication including lidocaine patch  for pain  Reevaluation of the patient after these medicines showed that the patient improved I have reviewed the patients home medicines and have made adjustments as needed  Test / Admission - Considered:  Patient is nontoxic, nonseptic appearing, in no apparent distress.  Patient's pain and other symptoms adequately managed in emergency department.  Labs, imaging and vitals reviewed.  Patient does not meet the SIRS or Sepsis criteria.  On repeat exam patient does not have a surgical abdomin and there are no peritoneal signs.  No indication of appendicitis, bowel obstruction, bowel perforation, cholecystitis, or diverticulitis. Given tenderness to palpation around the right side with recent heavy lifting and improvement with heat/ice and lidocaine patch, suspect that same is MSK in nature. Considered shingles, however no rash present and patient is up to date on vaccination. Patient is understanding and in agreement. Offered muscle relaxers and anti-inflammatory medication as well as lidocaine patches for management but patient states that he still has plenty from his PCP. Patient discharged home and given strict instructions for follow-up with their primary care physician.  I have also discussed reasons to return immediately to the ER.  Patient expresses understanding and agrees with plan.  Discharged in stable condition.   Final Clinical Impression(s) / ED Diagnoses Final diagnoses:  Right upper quadrant abdominal pain  Muscle strain    Rx / DC Orders ED Discharge Orders     None     An After Visit Summary was printed and given to the patient.     Silva Bandy,  PA-C 11/22/21 1945    Franne Forts, DO 11/25/21 [phone]

## 2021-11-22 NOTE — ED Provider Triage Note (Signed)
Emergency Medicine Provider Triage Evaluation Note  Peter Ortiz , a 56 y.o. male  was evaluated in triage.  Pt complains of right upper quadrant abdominal pain that radiates into the right flank.  Ongoing for about a week.  Worse with movements.  He was evaluated at PCP office and found to have elevated liver enzymes and sent to the emergency room for further evaluation.  Recently evaluated with x-ray which did not show any concerning findings.  He has been taking anti-inflammatory and muscle relaxer without relief.  Denies fever, chills, nausea, vomiting.  History of appendectomy.  Review of Systems  Positive: As above Negative: As above  Physical Exam  BP (!) 171/85 (BP Location: Left Arm)   Pulse 85   Temp 98.8 F (37.1 C) (Oral)   Resp 18   SpO2 99%  Gen:   Awake, no distress   Resp:  Normal effort  MSK:   Moves extremities without difficulty  Other:   Medical Decision Making  Medically screening exam initiated at 3:11 PM.  Appropriate orders placed.  Peter Ortiz was informed that the remainder of the evaluation will be completed by another provider, this initial triage assessment does not replace that evaluation, and the importance of remaining in the ED until their evaluation is complete.     Peter [place], PA-C 11/22/21 1512

## 2021-11-22 NOTE — Discharge Instructions (Addendum)
I recommend that you continue to take your muscle relaxer and anti-inflammatory medication for continued management of your symptoms.  I also recommend that you do heat and ice as well as passive stretching exercises to help with additional symptomatic relief.  Follow-up with your PCP in the next few days for continued evaluation and management of your symptoms.  Return if development of any new or worsening symptoms.

## 2021-11-22 NOTE — ED Triage Notes (Signed)
Pt reports intermittent RUQ pain that radiates to back x1.5 weeks. Pt reports going to PCP and having an X-ray done on Monday. Pt was sent here for further evaluation.

## 2021-12-14 IMAGING — CR DG CHEST 2V
2 series · 2 of 2 positions shown · non-contrast
Comparison: 12/21/2019

CLINICAL DATA: Cough.

EXAM:
CHEST - 2 VIEW

[w chest pa]
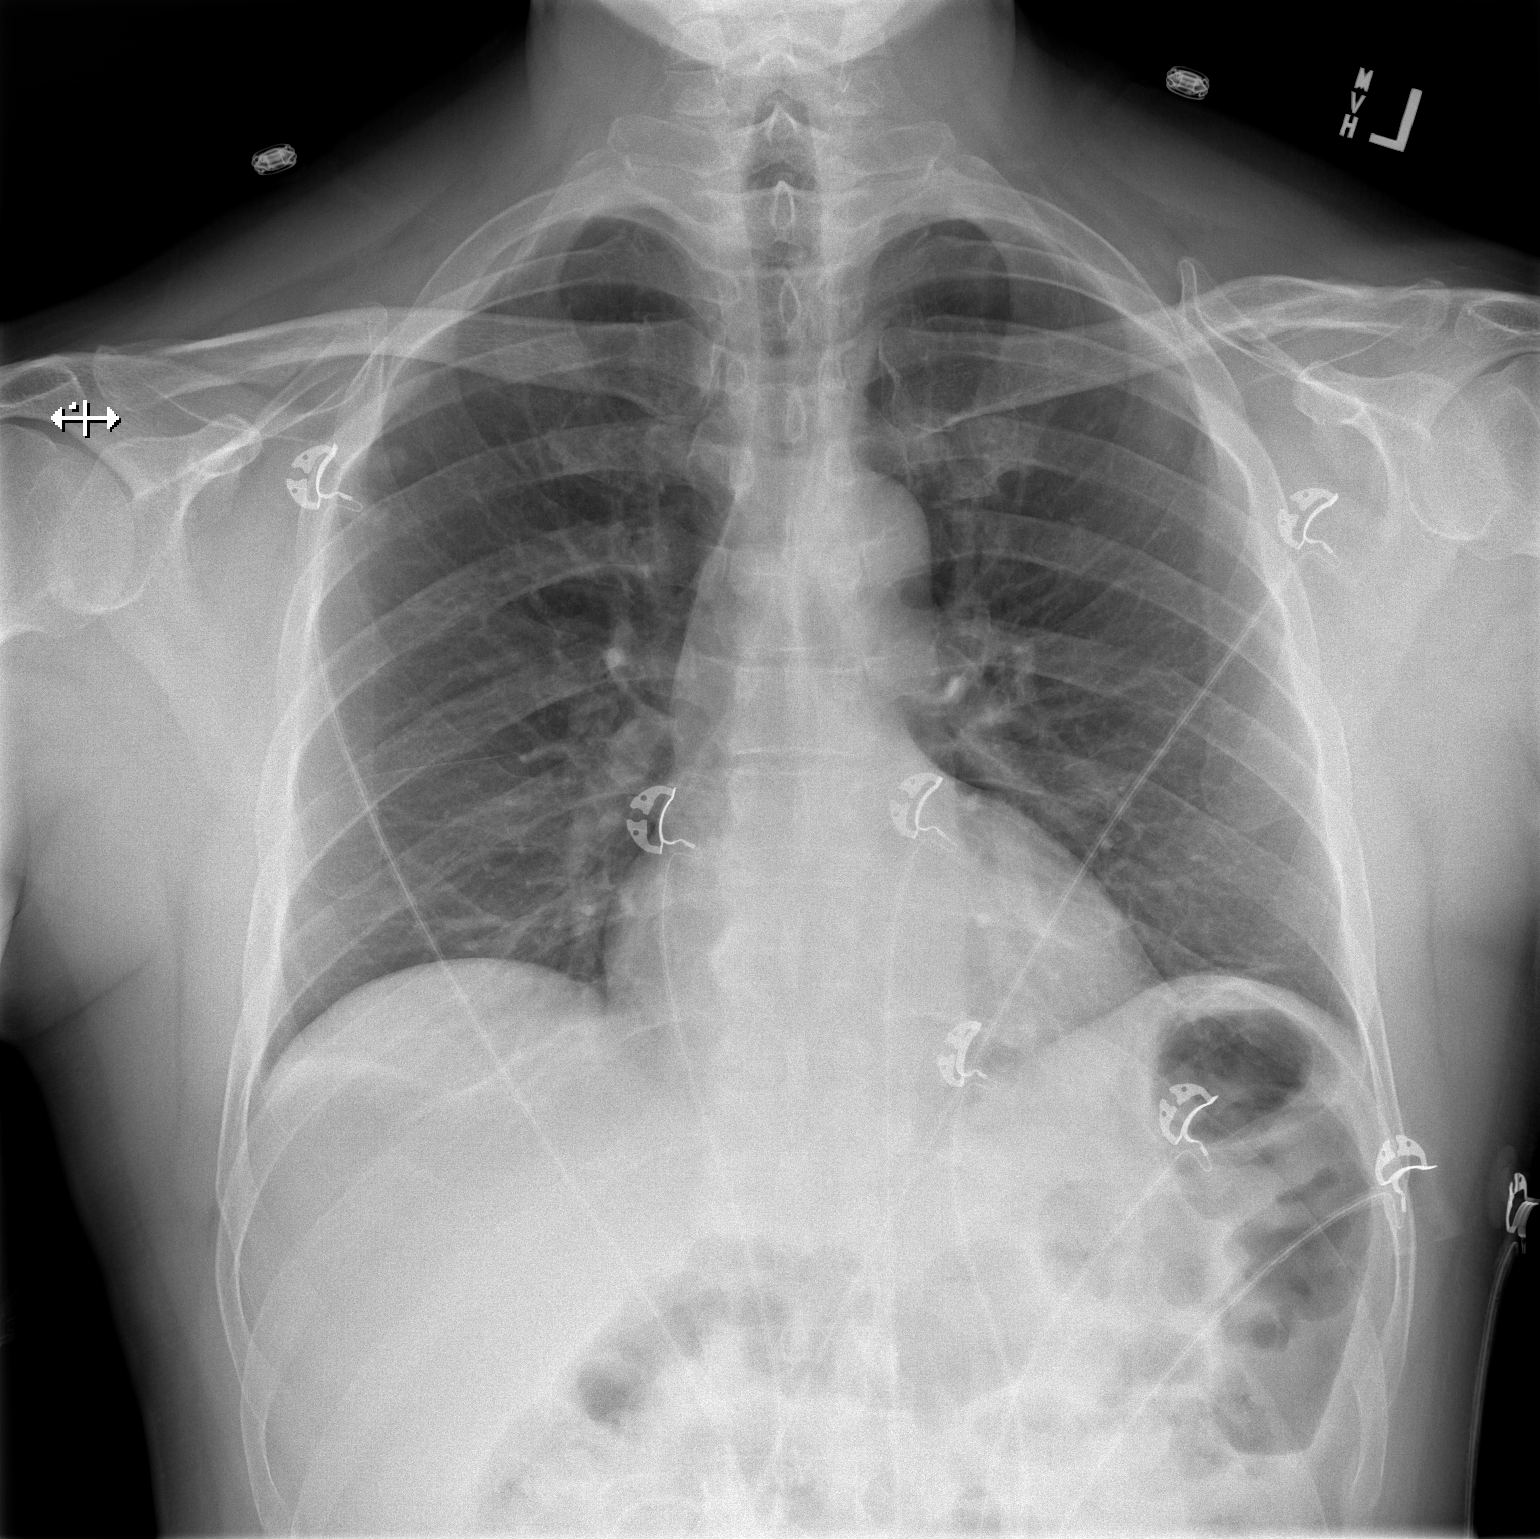

[w chest lat]
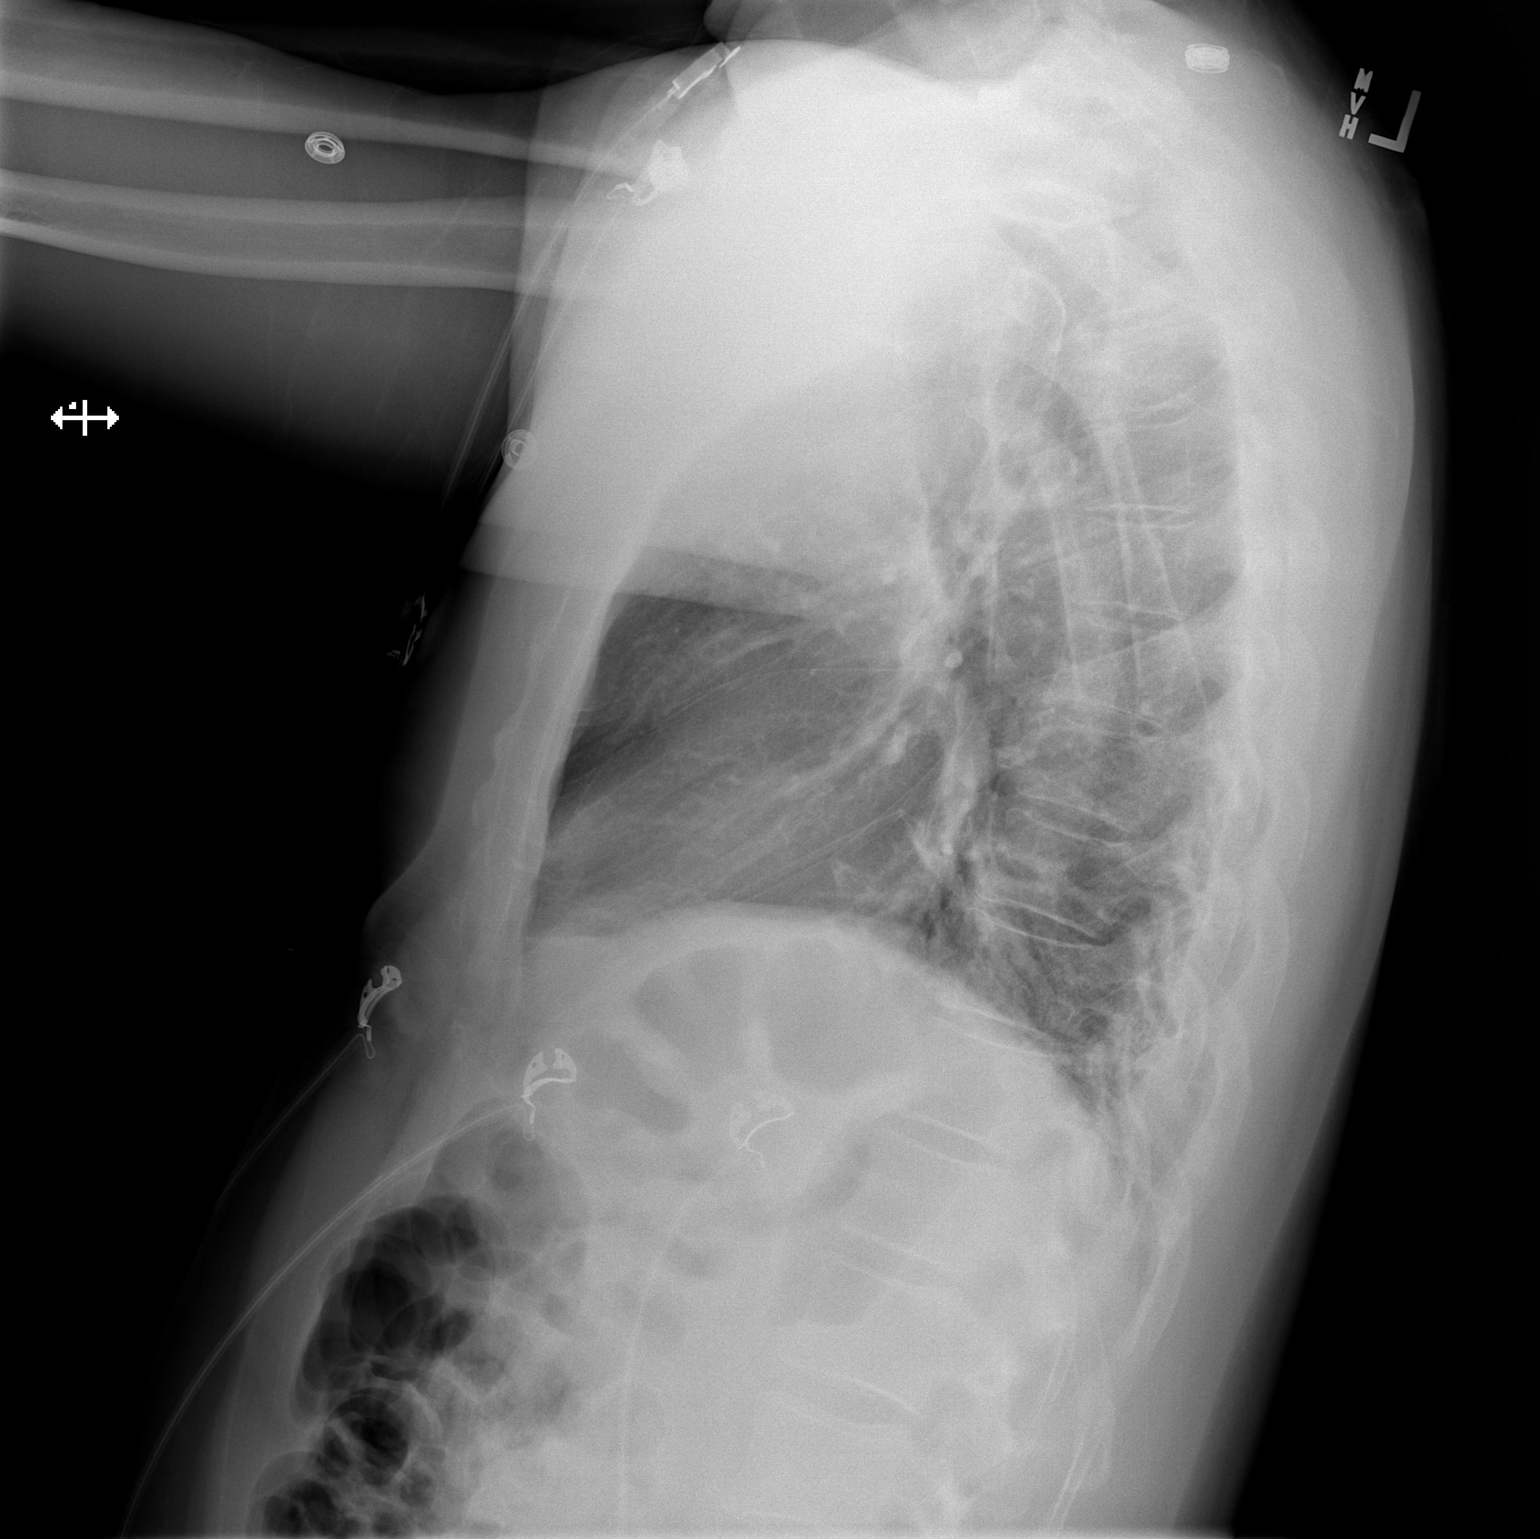

[2 of 2 positions shown; findings below may reference images not displayed]

FINDINGS: The cardiac silhouette, mediastinal and hilar contours are within
normal limits. Streaky subsegmental basilar atelectasis but no
infiltrates, edema or effusions. No pulmonary lesions. The bony
thorax is intact.
IMPRESSION: Streaky bibasilar atelectasis but no infiltrates or effusions.

## 2021-12-26 DIAGNOSIS — G4763 Sleep related bruxism: Secondary | ICD-10-CM | POA: Diagnosis not present

## 2021-12-26 DIAGNOSIS — R0683 Snoring: Secondary | ICD-10-CM | POA: Diagnosis not present

## 2021-12-26 DIAGNOSIS — F5104 Psychophysiologic insomnia: Secondary | ICD-10-CM | POA: Diagnosis not present

## 2022-01-06 DIAGNOSIS — A6920 Lyme disease, unspecified: Secondary | ICD-10-CM | POA: Diagnosis not present

## 2022-01-06 DIAGNOSIS — E785 Hyperlipidemia, unspecified: Secondary | ICD-10-CM | POA: Diagnosis not present

## 2022-01-22 DIAGNOSIS — G4733 Obstructive sleep apnea (adult) (pediatric): Secondary | ICD-10-CM | POA: Diagnosis not present

## 2022-02-07 DIAGNOSIS — G4733 Obstructive sleep apnea (adult) (pediatric): Secondary | ICD-10-CM | POA: Diagnosis not present

## 2022-02-11 DIAGNOSIS — A6002 Herpesviral infection of other male genital organs: Secondary | ICD-10-CM | POA: Diagnosis not present

## 2022-02-11 DIAGNOSIS — B009 Herpesviral infection, unspecified: Secondary | ICD-10-CM | POA: Diagnosis not present

## 2022-02-18 DIAGNOSIS — Z23 Encounter for immunization: Secondary | ICD-10-CM | POA: Diagnosis not present

## 2022-02-18 DIAGNOSIS — G4733 Obstructive sleep apnea (adult) (pediatric): Secondary | ICD-10-CM | POA: Diagnosis not present

## 2022-02-26 DIAGNOSIS — G4733 Obstructive sleep apnea (adult) (pediatric): Secondary | ICD-10-CM | POA: Diagnosis not present

## 2022-05-13 DIAGNOSIS — A6002 Herpesviral infection of other male genital organs: Secondary | ICD-10-CM | POA: Diagnosis not present

## 2022-05-13 DIAGNOSIS — R7303 Prediabetes: Secondary | ICD-10-CM | POA: Diagnosis not present

## 2022-05-13 DIAGNOSIS — I10 Essential (primary) hypertension: Secondary | ICD-10-CM | POA: Diagnosis not present

## 2022-05-13 DIAGNOSIS — E785 Hyperlipidemia, unspecified: Secondary | ICD-10-CM | POA: Diagnosis not present

## 2022-05-14 DIAGNOSIS — R7303 Prediabetes: Secondary | ICD-10-CM | POA: Diagnosis not present

## 2022-05-14 DIAGNOSIS — A6002 Herpesviral infection of other male genital organs: Secondary | ICD-10-CM | POA: Diagnosis not present

## 2022-05-14 DIAGNOSIS — E785 Hyperlipidemia, unspecified: Secondary | ICD-10-CM | POA: Diagnosis not present

## 2022-05-14 DIAGNOSIS — I10 Essential (primary) hypertension: Secondary | ICD-10-CM | POA: Diagnosis not present

## 2022-05-20 DIAGNOSIS — Z0189 Encounter for other specified special examinations: Secondary | ICD-10-CM | POA: Diagnosis not present

## 2022-05-30 DIAGNOSIS — J320 Chronic maxillary sinusitis: Secondary | ICD-10-CM | POA: Diagnosis not present

## 2022-05-30 DIAGNOSIS — R059 Cough, unspecified: Secondary | ICD-10-CM | POA: Diagnosis not present

## 2022-06-16 DIAGNOSIS — D72819 Decreased white blood cell count, unspecified: Secondary | ICD-10-CM | POA: Diagnosis not present

## 2022-06-17 DIAGNOSIS — J069 Acute upper respiratory infection, unspecified: Secondary | ICD-10-CM | POA: Diagnosis not present

## 2022-06-19 DIAGNOSIS — J020 Streptococcal pharyngitis: Secondary | ICD-10-CM | POA: Diagnosis not present

## 2022-06-25 DIAGNOSIS — M79642 Pain in left hand: Secondary | ICD-10-CM | POA: Diagnosis not present

## 2022-07-18 DIAGNOSIS — G4733 Obstructive sleep apnea (adult) (pediatric): Secondary | ICD-10-CM | POA: Diagnosis not present

## 2022-08-12 DIAGNOSIS — L723 Sebaceous cyst: Secondary | ICD-10-CM | POA: Diagnosis not present

## 2022-08-14 DIAGNOSIS — M65352 Trigger finger, left little finger: Secondary | ICD-10-CM | POA: Diagnosis not present

## 2022-08-14 DIAGNOSIS — M65342 Trigger finger, left ring finger: Secondary | ICD-10-CM | POA: Diagnosis not present

## 2022-08-31 DIAGNOSIS — M7061 Trochanteric bursitis, right hip: Secondary | ICD-10-CM | POA: Diagnosis not present

## 2022-09-01 DIAGNOSIS — M7061 Trochanteric bursitis, right hip: Secondary | ICD-10-CM | POA: Diagnosis not present

## 2022-11-09 DIAGNOSIS — M19072 Primary osteoarthritis, left ankle and foot: Secondary | ICD-10-CM | POA: Diagnosis not present

## 2022-11-09 DIAGNOSIS — M7732 Calcaneal spur, left foot: Secondary | ICD-10-CM | POA: Diagnosis not present

## 2022-11-10 DIAGNOSIS — E785 Hyperlipidemia, unspecified: Secondary | ICD-10-CM | POA: Diagnosis not present

## 2022-11-10 DIAGNOSIS — A609 Anogenital herpesviral infection, unspecified: Secondary | ICD-10-CM | POA: Diagnosis not present

## 2022-11-10 DIAGNOSIS — M19072 Primary osteoarthritis, left ankle and foot: Secondary | ICD-10-CM | POA: Diagnosis not present

## 2022-11-10 DIAGNOSIS — R7303 Prediabetes: Secondary | ICD-10-CM | POA: Diagnosis not present

## 2022-11-10 DIAGNOSIS — I10 Essential (primary) hypertension: Secondary | ICD-10-CM | POA: Diagnosis not present

## 2022-11-10 DIAGNOSIS — M7732 Calcaneal spur, left foot: Secondary | ICD-10-CM | POA: Diagnosis not present

## 2022-12-12 DIAGNOSIS — G4733 Obstructive sleep apnea (adult) (pediatric): Secondary | ICD-10-CM | POA: Diagnosis not present

## 2022-12-12 DIAGNOSIS — G4763 Sleep related bruxism: Secondary | ICD-10-CM | POA: Diagnosis not present

## 2022-12-19 DIAGNOSIS — I10 Essential (primary) hypertension: Secondary | ICD-10-CM | POA: Diagnosis not present

## 2022-12-19 DIAGNOSIS — E7800 Pure hypercholesterolemia, unspecified: Secondary | ICD-10-CM | POA: Diagnosis not present

## 2022-12-19 DIAGNOSIS — Z0000 Encounter for general adult medical examination without abnormal findings: Secondary | ICD-10-CM | POA: Diagnosis not present

## 2022-12-19 DIAGNOSIS — Z8619 Personal history of other infectious and parasitic diseases: Secondary | ICD-10-CM | POA: Diagnosis not present

## 2022-12-19 DIAGNOSIS — E559 Vitamin D deficiency, unspecified: Secondary | ICD-10-CM | POA: Diagnosis not present

## 2022-12-19 DIAGNOSIS — N529 Male erectile dysfunction, unspecified: Secondary | ICD-10-CM | POA: Diagnosis not present

## 2022-12-24 DIAGNOSIS — G4733 Obstructive sleep apnea (adult) (pediatric): Secondary | ICD-10-CM | POA: Diagnosis not present

## 2023-01-12 DIAGNOSIS — L603 Nail dystrophy: Secondary | ICD-10-CM | POA: Diagnosis not present

## 2023-01-12 DIAGNOSIS — M722 Plantar fascial fibromatosis: Secondary | ICD-10-CM | POA: Diagnosis not present

## 2023-02-10 DIAGNOSIS — Z23 Encounter for immunization: Secondary | ICD-10-CM | POA: Diagnosis not present

## 2023-03-02 DIAGNOSIS — G4733 Obstructive sleep apnea (adult) (pediatric): Secondary | ICD-10-CM | POA: Diagnosis not present

## 2023-03-04 DIAGNOSIS — M25561 Pain in right knee: Secondary | ICD-10-CM | POA: Diagnosis not present

## 2023-03-20 DIAGNOSIS — H2513 Age-related nuclear cataract, bilateral: Secondary | ICD-10-CM | POA: Diagnosis not present

## 2023-03-20 DIAGNOSIS — H527 Unspecified disorder of refraction: Secondary | ICD-10-CM | POA: Diagnosis not present

## 2023-03-20 DIAGNOSIS — H04123 Dry eye syndrome of bilateral lacrimal glands: Secondary | ICD-10-CM | POA: Diagnosis not present

## 2023-04-10 DIAGNOSIS — A6000 Herpesviral infection of urogenital system, unspecified: Secondary | ICD-10-CM | POA: Diagnosis not present

## 2023-04-13 NOTE — Patient Instructions (Incomplete)
Chronic rhinitis (negative skin test-2021) Start Nasacort 1-2 sprays each nostril once a day as needed for stuffy nose Continue azelastine 1-2 sprays each nostril twice a day as needed for runny nose/drainage down throat May use saline nasal rinse as needed. Use this prior to any medicated nasal sprays   Cough, persistent Resolved   Please let us known if this treatment plan is not working well for you. Schedule a follow up appointment in 3 months

## 2023-04-14 ENCOUNTER — Ambulatory Visit: Payer: Federal, State, Local not specified - PPO | Admitting: Family

## 2023-04-14 ENCOUNTER — Encounter: Payer: Self-pay | Admitting: Family

## 2023-04-14 ENCOUNTER — Other Ambulatory Visit: Payer: Self-pay

## 2023-04-14 VITALS — BP 132/76 | HR 73 | Temp 97.9°F | Resp 18 | Wt 190.8 lb

## 2023-04-14 DIAGNOSIS — J310 Chronic rhinitis: Secondary | ICD-10-CM

## 2023-04-14 DIAGNOSIS — K149 Disease of tongue, unspecified: Secondary | ICD-10-CM | POA: Diagnosis not present

## 2023-04-14 NOTE — Progress Notes (Signed)
[address] [place] [address] Dept: [phone]  FOLLOW UP NOTE  Patient ID: Peter Ortiz, male    DOB: Dec 31, 1965  Age: 57 y.o. MRN: [id number] Date of Office Visit: 04/14/2023  Assessment  Chief Complaint: Other ( irritated tongue x 2 weeks)  HPI Peter Ortiz is a 57 year old male who presents today for an acute visit of irritated/burning of tongue.  He was last seen on June 29, 2020 by myself for chronic rhinitis  and cough that has resolved.  He reports since his last office visit he has been diagnosed with severe sleep apnea and has a CPAP.  He reports since wearing his CPAP his mouth is dry.  He reports for the past 2 weeks his tongue has been irritated and burning at times.  There are times that the burning is constant.  The burning or irritation moves around his tongue.  About 3 to 4 weeks ago he did have a bump on the tip of his tongue that went away.  He reports he has had these bumps before.  He is not taking any new medications.  He has been using Crest toothpaste all the time except he did try a toothpaste for whitening that was his wife's about 2 weeks ago.  He is not eating any new foods, but mentions he eats a lot of citrus. He has not noticed any changes in the color of his tongue, swelling, or itching of the tongue.  He denies any concomitant cardiorespiratory or gastrointestinal symptoms.  He has never seen ENT for his tongue.  He mentions that he grinds his teeth at night and wears a nightguard that he has had for 25 years.  He wonders if he is biting his tongue.  He does not smoke or use smokeless tobacco products.  He also does not drink alcohol.  He denies any history of malignancy.  He also denies bleeding from his tongue, fevers, chills, or enlarged lymph nodes.  Chronic rhinitis: He reports rhinorrhea, nasal congestion, postnasal drip.  He would reports that his sinus symptoms are not bad.  He will occasionally use Allegra or Zyrtec.  He is not currently  using any nose sprays but mentions he used to use nose sprays in the past.  He reports a couple sinus infections in the past year.   Drug Allergies:  Allergies  Allergen Reactions   Amoxicillin-Pot Clavulanate Other (See Comments)   Ofloxacin Other (See Comments)   Other Rash and Other (See Comments)   Sulfamethoxazole Rash    Review of Systems: Negative except as per HPI   Physical Exam: BP 132/76   Pulse 73   Temp 97.9 F (36.6 C) (Temporal)   Resp 18   Wt 190 lb 12.8 oz (86.5 kg)   SpO2 98%   BMI 29.01 kg/m    Physical Exam Constitutional:      Appearance: Normal appearance.  HENT:     Head: Normocephalic and atraumatic.     Comments: Pharynx normal, tongue with no abnormalities, eyes normal, ears normal, nose normal    Right Ear: Tympanic membrane, ear canal and external ear normal.     Left Ear: Tympanic membrane, ear canal and external ear normal.     Nose: Nose normal.     Mouth/Throat:     Mouth: Mucous membranes are moist.     Pharynx: Oropharynx is clear.  Eyes:     Conjunctiva/sclera: Conjunctivae normal.  Cardiovascular:     Rate and  Rhythm: Regular rhythm.     Heart sounds: Normal heart sounds.  Pulmonary:     Effort: Pulmonary effort is normal.     Breath sounds: Normal breath sounds.     Comments: Lungs clear to auscultation Musculoskeletal:     Cervical back: Neck supple.  Skin:    General: Skin is warm.  Neurological:     Mental Status: He is alert and oriented to person, place, and time.  Psychiatric:        Mood and Affect: Mood normal.        Behavior: Behavior normal.        Thought Content: Thought content normal.        Judgment: Judgment normal.     Diagnostics:  None  Assessment and Plan: 1. Irritation of tongue   2. Chronic rhinitis     No orders of the defined types were placed in this encounter.   Patient Instructions  Chronic rhinitis (negative skin test-2021) Continue over-the-counter antihistamines such as  Allegra, Zyrtec, Xyzal, or Claritin once a day as needed May use saline nasal rinse as needed. Use this prior to any medicated nasal sprays   Possible burning tongue -Stop eating all citrus foods -Schedule for true patch test.  He will also bring the toothpaste that he uses along with his wife's toothpaste and any other products he uses in his mouth for patch testing.  You will need to be off all steroids 4 weeks prior to this appointment. -Patches will be placed on a Monday and we will have readings on Wednesday and Friday   Please let us known if this treatment plan is not working well for you. Schedule an appointment for true patch testing plus your own personal items  Return if symptoms worsen or fail to improve, for patch testing.    Thank you for the opportunity to care for this patient.  Please do not hesitate to contact me with questions.  Nehemiah Settle, FNP [facility]

## 2023-05-04 DIAGNOSIS — E785 Hyperlipidemia, unspecified: Secondary | ICD-10-CM | POA: Diagnosis not present

## 2023-05-04 DIAGNOSIS — A6000 Herpesviral infection of urogenital system, unspecified: Secondary | ICD-10-CM | POA: Diagnosis not present

## 2023-05-04 DIAGNOSIS — I10 Essential (primary) hypertension: Secondary | ICD-10-CM | POA: Diagnosis not present

## 2023-05-04 DIAGNOSIS — R7303 Prediabetes: Secondary | ICD-10-CM | POA: Diagnosis not present

## 2023-05-11 ENCOUNTER — Ambulatory Visit: Payer: BC Managed Care – PPO | Admitting: Family

## 2023-05-11 ENCOUNTER — Encounter: Payer: Self-pay | Admitting: Family

## 2023-05-11 DIAGNOSIS — K149 Disease of tongue, unspecified: Secondary | ICD-10-CM | POA: Diagnosis not present

## 2023-05-11 DIAGNOSIS — K121 Other forms of stomatitis: Secondary | ICD-10-CM | POA: Diagnosis not present

## 2023-05-11 NOTE — Progress Notes (Signed)
Follow-up Note  RE: Peter Ortiz MRN: [id number] DOB: 04/13/1966 Date of Office Visit: 05/11/2023  Primary care provider: Tally Joe, MD Referring provider: Tally Joe, MD   [name] returns to the office today for the patch test placement, given suspected history of contact dermatitis.  He reports that his tongue has been feeling a little bit better since decreasing the amount of citrus he is eating and not using his wife's Sensodyne toothpaste.  There has maybe been 2 episodes of tongue irritation since his last office visit on April 14, 2023.   Diagnostics: True Test patches, Sensodyne Extra Fresh Rapid Relief, and Crest Cavity protection.    Plan:   Allergic contact dermatitis - Instructions provided on care of the patches for the next 48 hours. - Cannan was instructed to avoid showering for the next 48 hours. - Torrez will follow up in 48 hours and 96 hours for patch readings.  Nehemiah Settle, FNP [facility]

## 2023-05-12 DIAGNOSIS — L239 Allergic contact dermatitis, unspecified cause: Secondary | ICD-10-CM | POA: Diagnosis not present

## 2023-05-13 ENCOUNTER — Encounter: Payer: Self-pay | Admitting: Internal Medicine

## 2023-05-13 ENCOUNTER — Ambulatory Visit (INDEPENDENT_AMBULATORY_CARE_PROVIDER_SITE_OTHER): Payer: BC Managed Care – PPO | Admitting: Internal Medicine

## 2023-05-13 DIAGNOSIS — K121 Other forms of stomatitis: Secondary | ICD-10-CM

## 2023-05-13 NOTE — Progress Notes (Signed)
   Follow Up Note  RE: LAJUAN PASCHEN MRN: [id number] DOB: 09-13-1965 Date of Office Visit: 05/13/2023  Referring provider: Tally Joe, MD Primary care provider: Tally Joe, MD  History of Present Illness: I had the pleasure of seeing Carloyn Manner for a follow up visit at the [facility] on 05/13/2023. He is a 58 y.o. male, who is being followed for contact dermatitis . Today he is here for initial patch test interpretation, given suspected history of contact dermatitis.   Diagnostics:  TRUE TEST 48 hour reading: equivocal to carba mix, gold sodium thiosulfate and sensodyne     Assessment and Plan: Uno is a 58 y.o. male with: Concern for Contact Dermatitis:  The patient has been provided detailed information regarding the substances he is sensitive to, as well as products containing the substances.  Meticulous avoidance of these substances is recommended. If avoidance is not possible, the use of barrier creams or lotions is recommended. If symptoms persist or progress despite meticulous avoidance of chemicals/substances above, dermatology evaluation may be warranted. No follow-ups on file.  It was my pleasure to see Leandrew today and participate in his care. Please feel free to contact me with any questions or concerns.  Sincerely,   Ferol Luz, MD [facility]

## 2023-05-15 ENCOUNTER — Encounter: Payer: Federal, State, Local not specified - PPO | Admitting: Family

## 2023-05-15 ENCOUNTER — Ambulatory Visit (INDEPENDENT_AMBULATORY_CARE_PROVIDER_SITE_OTHER): Payer: Federal, State, Local not specified - PPO | Admitting: Family

## 2023-05-15 ENCOUNTER — Encounter: Payer: Self-pay | Admitting: Internal Medicine

## 2023-05-15 DIAGNOSIS — K121 Other forms of stomatitis: Secondary | ICD-10-CM

## 2023-05-15 NOTE — Progress Notes (Signed)
Peter Ortiz returns to the office today for the final patch test interpretation, given suspected history of contact dermatitis. He reports that he went to his dermatologist on Tuesday due to a rash on his neck. He was using [employer] products that his wife got him. His dermatologist instructed him to stop using the fragrances and prescribed desonide 0.05%.    Diagnostics:   TRUE TEST 96-hour hour reading: +/- Fragrance Mix, + Carba Mix, + Gold Sodium Thiosulfate, + Disperse Blue 106, and +/- Sensodyne   Plan:   Allergic contact dermatitis - The patient has been provided detailed information regarding the substances she is sensitive to, as well as products containing the substances.   - Meticulous avoidance of these substances is recommended.  - If avoidance is not possible, the use of barrier creams or lotions is recommended. - If symptoms persist or progress despite meticulous avoidance of Fragrance Mix, Carba Mix, Gold Sodium Thiosulfate, Disperse Blue, and Sensodyne, Dermatology Referral may be warranted.  Peter Settle, FNP [facility]

## 2023-05-15 NOTE — Patient Instructions (Signed)
Allergic contact dermatitis - The patient has been provided detailed information regarding the substances she is sensitive to, as well as products containing the substances.   - Meticulous avoidance of these substances is recommended.  - If avoidance is not possible, the use of barrier creams or lotions is recommended. - If symptoms persist or progress despite meticulous avoidance of Fragrance Mix, Carba Mix, Gold Sodium Thiosulfate, Disperse Blue, and Sensodyne, Dermatology Referral may be warranted.  Follow up in 6 months or sooner if needed

## 2023-07-27 DIAGNOSIS — Z1211 Encounter for screening for malignant neoplasm of colon: Secondary | ICD-10-CM | POA: Diagnosis not present

## 2023-07-27 DIAGNOSIS — K5140 Inflammatory polyps of colon without complications: Secondary | ICD-10-CM | POA: Diagnosis not present

## 2023-09-01 DIAGNOSIS — L723 Sebaceous cyst: Secondary | ICD-10-CM | POA: Diagnosis not present

## 2023-09-08 DIAGNOSIS — L723 Sebaceous cyst: Secondary | ICD-10-CM | POA: Diagnosis not present

## 2023-10-19 DIAGNOSIS — L239 Allergic contact dermatitis, unspecified cause: Secondary | ICD-10-CM | POA: Diagnosis not present

## 2023-10-19 DIAGNOSIS — L0889 Other specified local infections of the skin and subcutaneous tissue: Secondary | ICD-10-CM | POA: Diagnosis not present

## 2024-01-28 DIAGNOSIS — G8929 Other chronic pain: Secondary | ICD-10-CM | POA: Diagnosis not present

## 2024-01-28 DIAGNOSIS — M25562 Pain in left knee: Secondary | ICD-10-CM | POA: Diagnosis not present

## 2024-01-28 DIAGNOSIS — M25762 Osteophyte, left knee: Secondary | ICD-10-CM | POA: Diagnosis not present

## 2024-02-05 DIAGNOSIS — E559 Vitamin D deficiency, unspecified: Secondary | ICD-10-CM | POA: Diagnosis not present

## 2024-02-05 DIAGNOSIS — N529 Male erectile dysfunction, unspecified: Secondary | ICD-10-CM | POA: Diagnosis not present

## 2024-02-05 DIAGNOSIS — R7303 Prediabetes: Secondary | ICD-10-CM | POA: Diagnosis not present

## 2024-02-05 DIAGNOSIS — Z23 Encounter for immunization: Secondary | ICD-10-CM | POA: Diagnosis not present

## 2024-02-05 DIAGNOSIS — I10 Essential (primary) hypertension: Secondary | ICD-10-CM | POA: Diagnosis not present

## 2024-02-05 DIAGNOSIS — Z0000 Encounter for general adult medical examination without abnormal findings: Secondary | ICD-10-CM | POA: Diagnosis not present

## 2024-02-05 DIAGNOSIS — D72819 Decreased white blood cell count, unspecified: Secondary | ICD-10-CM | POA: Diagnosis not present

## 2024-02-05 DIAGNOSIS — E7800 Pure hypercholesterolemia, unspecified: Secondary | ICD-10-CM | POA: Diagnosis not present

## 2024-02-05 DIAGNOSIS — Z125 Encounter for screening for malignant neoplasm of prostate: Secondary | ICD-10-CM | POA: Diagnosis not present
